# Patient Record
Sex: Female | Born: 1970 | Race: Black or African American | Hispanic: No | Marital: Married | Smoking: Never smoker
Health system: Southern US, Community
[De-identification: ages and names within clinical notes are randomized; demographics above are authoritative.]

## PROBLEM LIST (undated history)

## (undated) DIAGNOSIS — I639 Cerebral infarction, unspecified: Secondary | ICD-10-CM

## (undated) DIAGNOSIS — N289 Disorder of kidney and ureter, unspecified: Secondary | ICD-10-CM

## (undated) DIAGNOSIS — G43909 Migraine, unspecified, not intractable, without status migrainosus: Secondary | ICD-10-CM

## (undated) DIAGNOSIS — I10 Essential (primary) hypertension: Secondary | ICD-10-CM

## (undated) DIAGNOSIS — F419 Anxiety disorder, unspecified: Secondary | ICD-10-CM

## (undated) DIAGNOSIS — E119 Type 2 diabetes mellitus without complications: Secondary | ICD-10-CM

## (undated) HISTORY — PX: ABDOMINAL HYSTERECTOMY: SHX81

## (undated) HISTORY — PX: OTHER SURGICAL HISTORY: SHX169

---

## 2001-03-29 ENCOUNTER — Emergency Department (HOSPITAL_COMMUNITY): Admission: EM | Admit: 2001-03-29 | Discharge: 2001-03-29 | Payer: Self-pay | Admitting: Emergency Medicine

## 2001-12-23 ENCOUNTER — Emergency Department (HOSPITAL_COMMUNITY): Admission: EM | Admit: 2001-12-23 | Discharge: 2001-12-24 | Payer: Self-pay | Admitting: Emergency Medicine

## 2004-09-30 ENCOUNTER — Ambulatory Visit: Payer: Self-pay | Admitting: Internal Medicine

## 2004-10-21 ENCOUNTER — Ambulatory Visit: Payer: Self-pay | Admitting: Internal Medicine

## 2005-03-27 ENCOUNTER — Emergency Department (HOSPITAL_COMMUNITY): Admission: EM | Admit: 2005-03-27 | Discharge: 2005-03-27 | Payer: Self-pay | Admitting: Emergency Medicine

## 2005-05-26 ENCOUNTER — Ambulatory Visit: Payer: Self-pay | Admitting: Internal Medicine

## 2005-06-20 ENCOUNTER — Ambulatory Visit: Payer: Self-pay | Admitting: Internal Medicine

## 2005-07-15 ENCOUNTER — Inpatient Hospital Stay (HOSPITAL_COMMUNITY): Admission: RE | Admit: 2005-07-15 | Discharge: 2005-07-16 | Payer: Self-pay | Admitting: Obstetrics and Gynecology

## 2005-07-21 ENCOUNTER — Inpatient Hospital Stay (HOSPITAL_COMMUNITY): Admission: AD | Admit: 2005-07-21 | Discharge: 2005-07-21 | Payer: Self-pay | Admitting: Obstetrics & Gynecology

## 2007-07-20 ENCOUNTER — Emergency Department (HOSPITAL_COMMUNITY): Admission: EM | Admit: 2007-07-20 | Discharge: 2007-07-20 | Payer: Self-pay | Admitting: Emergency Medicine

## 2008-06-15 ENCOUNTER — Emergency Department (HOSPITAL_COMMUNITY): Admission: EM | Admit: 2008-06-15 | Discharge: 2008-06-15 | Payer: Self-pay | Admitting: Emergency Medicine

## 2008-08-09 ENCOUNTER — Ambulatory Visit (HOSPITAL_BASED_OUTPATIENT_CLINIC_OR_DEPARTMENT_OTHER): Admission: RE | Admit: 2008-08-09 | Discharge: 2008-08-09 | Payer: Self-pay | Admitting: General Surgery

## 2008-08-09 ENCOUNTER — Encounter (HOSPITAL_BASED_OUTPATIENT_CLINIC_OR_DEPARTMENT_OTHER): Payer: Self-pay | Admitting: General Surgery

## 2009-01-04 ENCOUNTER — Encounter: Admission: RE | Admit: 2009-01-04 | Discharge: 2009-01-04 | Payer: Self-pay | Admitting: Emergency Medicine

## 2009-03-15 ENCOUNTER — Encounter: Admission: RE | Admit: 2009-03-15 | Discharge: 2009-03-15 | Payer: Self-pay | Admitting: Emergency Medicine

## 2010-03-25 ENCOUNTER — Encounter: Admission: RE | Admit: 2010-03-25 | Discharge: 2010-03-25 | Payer: Self-pay | Admitting: Emergency Medicine

## 2010-04-11 ENCOUNTER — Ambulatory Visit (HOSPITAL_COMMUNITY): Admission: RE | Admit: 2010-04-11 | Discharge: 2010-04-11 | Payer: Self-pay | Admitting: Emergency Medicine

## 2010-04-21 ENCOUNTER — Encounter: Admission: RE | Admit: 2010-04-21 | Discharge: 2010-04-21 | Payer: Self-pay | Admitting: Emergency Medicine

## 2010-08-03 ENCOUNTER — Emergency Department (HOSPITAL_COMMUNITY): Admission: EM | Admit: 2010-08-03 | Discharge: 2010-08-03 | Payer: Self-pay | Admitting: Emergency Medicine

## 2011-03-06 LAB — DIFFERENTIAL
Basophils Absolute: 0 10*3/uL (ref 0.0–0.1)
Basophils Relative: 0 % (ref 0–1)
Eosinophils Absolute: 0 10*3/uL (ref 0.0–0.7)
Eosinophils Relative: 1 % (ref 0–5)
Lymphocytes Relative: 27 % (ref 12–46)
Lymphs Abs: 1.5 10*3/uL (ref 0.7–4.0)
Monocytes Absolute: 0.6 10*3/uL (ref 0.1–1.0)
Monocytes Relative: 10 % (ref 3–12)

## 2011-03-06 LAB — URINALYSIS, ROUTINE W REFLEX MICROSCOPIC
Bilirubin Urine: NEGATIVE
Hgb urine dipstick: NEGATIVE
Ketones, ur: NEGATIVE mg/dL
Nitrite: NEGATIVE
Specific Gravity, Urine: 1.023 (ref 1.005–1.030)
pH: 7.5 (ref 5.0–8.0)

## 2011-03-06 LAB — CBC
HCT: 39.2 % (ref 36.0–46.0)
Hemoglobin: 13.5 g/dL (ref 12.0–15.0)
MCHC: 34.4 g/dL (ref 30.0–36.0)
MCV: 91.3 fL (ref 78.0–100.0)
Platelets: 291 10*3/uL (ref 150–400)
RDW: 14.6 % (ref 11.5–15.5)

## 2011-03-06 LAB — WET PREP, GENITAL: Clue Cells Wet Prep HPF POC: NONE SEEN

## 2011-03-06 LAB — COMPREHENSIVE METABOLIC PANEL
ALT: 18 U/L (ref 0–35)
Alkaline Phosphatase: 59 U/L (ref 39–117)
GFR calc Af Amer: >60 mL/min (ref >60)
Sodium: 142 mEq/L (ref 135–145)
Total Bilirubin: 0.4 mg/dL (ref 0.3–1.2)

## 2011-03-06 LAB — GC/CHLAMYDIA PROBE AMP, GENITAL
Chlamydia, DNA Probe: NEGATIVE
GC Probe Amp, Genital: NEGATIVE

## 2011-03-06 LAB — URINE CULTURE

## 2011-04-19 ENCOUNTER — Emergency Department (HOSPITAL_COMMUNITY)
Admission: EM | Admit: 2011-04-19 | Discharge: 2011-04-19 | Disposition: A | Discharge status: Home / Self Care | Payer: BC Managed Care – PPO | Attending: Emergency Medicine | Admitting: Emergency Medicine

## 2011-04-19 DIAGNOSIS — Y9229 Other specified public building as the place of occurrence of the external cause: Secondary | ICD-10-CM | POA: Insufficient documentation

## 2011-04-19 DIAGNOSIS — W2203XA Walked into furniture, initial encounter: Secondary | ICD-10-CM | POA: Insufficient documentation

## 2011-04-19 DIAGNOSIS — S81009A Unspecified open wound, unspecified knee, initial encounter: Secondary | ICD-10-CM | POA: Insufficient documentation

## 2011-04-19 DIAGNOSIS — Y9341 Activity, dancing: Secondary | ICD-10-CM | POA: Insufficient documentation

## 2011-04-26 ENCOUNTER — Emergency Department (HOSPITAL_COMMUNITY): Payer: BC Managed Care – PPO

## 2011-04-26 ENCOUNTER — Emergency Department (HOSPITAL_COMMUNITY)
Admission: EM | Admit: 2011-04-26 | Discharge: 2011-04-26 | Disposition: A | Discharge status: Home / Self Care | Payer: BC Managed Care – PPO | Attending: Emergency Medicine | Admitting: Emergency Medicine

## 2011-04-26 DIAGNOSIS — K7689 Other specified diseases of liver: Secondary | ICD-10-CM | POA: Insufficient documentation

## 2011-04-26 DIAGNOSIS — Z9079 Acquired absence of other genital organ(s): Secondary | ICD-10-CM | POA: Insufficient documentation

## 2011-04-26 DIAGNOSIS — R10813 Right lower quadrant abdominal tenderness: Secondary | ICD-10-CM | POA: Insufficient documentation

## 2011-04-26 DIAGNOSIS — Z90710 Acquired absence of both cervix and uterus: Secondary | ICD-10-CM | POA: Insufficient documentation

## 2011-04-26 DIAGNOSIS — R112 Nausea with vomiting, unspecified: Secondary | ICD-10-CM | POA: Insufficient documentation

## 2011-04-26 DIAGNOSIS — R1904 Left lower quadrant abdominal swelling, mass and lump: Secondary | ICD-10-CM | POA: Insufficient documentation

## 2011-04-26 DIAGNOSIS — R109 Unspecified abdominal pain: Secondary | ICD-10-CM | POA: Insufficient documentation

## 2011-04-26 LAB — COMPREHENSIVE METABOLIC PANEL
ALT: 12 U/L (ref 0–35)
AST: 17 U/L (ref 0–37)
Albumin: 3.7 g/dL (ref 3.5–5.2)
GFR calc Af Amer: >60 mL/min (ref >60)
Glucose, Bld: 102 mg/dL — ABNORMAL HIGH (ref 70–99)
Potassium: 3.9 mEq/L (ref 3.5–5.1)

## 2011-04-26 LAB — CBC
HCT: 39.8 % (ref 36.0–46.0)
MCH: 30 pg (ref 26.0–34.0)
Platelets: 291 10*3/uL (ref 150–400)
RDW: 14 % (ref 11.5–15.5)
WBC: 5.6 10*3/uL (ref 4.0–10.5)

## 2011-04-26 LAB — DIFFERENTIAL
Basophils Absolute: 0 10*3/uL (ref 0.0–0.1)
Eosinophils Absolute: 0.1 10*3/uL (ref 0.0–0.7)
Monocytes Absolute: 0.2 10*3/uL (ref 0.1–1.0)
Monocytes Relative: 4 % (ref 3–12)
Neutrophils Relative %: 69 % (ref 43–77)

## 2011-04-26 LAB — URINALYSIS, ROUTINE W REFLEX MICROSCOPIC
Glucose, UA: NEGATIVE mg/dL
Nitrite: NEGATIVE
Protein, ur: NEGATIVE mg/dL

## 2011-04-26 MED ORDER — IOHEXOL 300 MG/ML  SOLN
125.0000 mL | Freq: Once | INTRAMUSCULAR | Status: AC | PRN
  Administered 2011-04-26: 125 mL via INTRAVENOUS

## 2011-05-05 NOTE — Op Note (Signed)
Emily Carrillo, Emily Carrillo                 ACCOUNT NO.:  [id number]   MEDICAL RECORD NO.:  [id number]          PATIENT TYPE:  AMB   LOCATION:  DSC                          FACILITY:  [facility]   PHYSICIAN:  Leonie Man, M.D.   DATE OF BIRTH:  04/23/1971   DATE OF PROCEDURE:  08/09/2008  DATE OF DISCHARGE:                               OPERATIVE REPORT   PREOPERATIVE DIAGNOSIS:  Mass right parietal scalp.   POSTOPERATIVE DIAGNOSIS:  Osteophyte, parietal scalp on the right.   PROCEDURE:  Excision osteophyte of the skull.   SURGEON:  Leonie Man, MD   ASSISTANT:  OR nurse.   ANESTHESIA:  General.   SPECIMENS TO LAB:  Bony osteophyte.   ESTIMATED BLOOD LOSS:  Minimal.   COMPLICATIONS:  None apparent.   DISPOSITION:  The patient returned to the PACU in excellent condition.   The patient is a 40 year old female with pain and mass in the scalp on  the right side in the parietal area.  This has been causing her  increasing discomfort particularly when touched or whenever she combs  her hair.  She comes to the operating room now desiring to have this  removed.   PROCEDURE:  The patient was positioned in the lateral position.  Following the induction of satisfactory general anesthesia, the region  of the parietal scalp was then shaved and then prepped and draped to be  included in a sterile operative field.  This region was infiltrated with  0.5% Marcaine with epinephrine.  A transverse incision was made down  through the scalp, carrying the dissection down through the parietal  muscles of the scalp down to the calvarium where the mass was  encountered.  This was excised by using an osteotome to shave this mass  off.  The edges were then rasped with a small bone rasp, until smooth.  All the area of the dissection was checked for hemostasis and no  additional bleeding points were noted.  Sponge and instruments counts  verified.  The parietal  muscles closed with 2-0 Vicryl sutures.   The skin was closed with  interrupted 4-0 Prolene sutures.  The wound was then reinforced with  Dermabond and a sterile compressive dressing was placed.  The anesthetic  reversed, and the patient removed from the operating room to the  recovery room in stable condition.  She tolerated the procedure well.      Leonie Man, M.D.  Electronically Signed     PB/MEDQ  D:  08/09/2008  T:  08/10/2008  Job:  [id number]   cc:   Dollene Cleveland, M.D.

## 2011-05-08 NOTE — Op Note (Signed)
NAME:  Emily Carrillo, Emily Carrillo                  ACCOUNT NO.:  [id number]   MEDICAL RECORD NO.:  [id number]          PATIENT TYPE:  OBV   LOCATION:  9305                          FACILITY:  [facility]   PHYSICIAN:  Randye Lobo, M.D.   DATE OF BIRTH:  1971-04-20   DATE OF PROCEDURE:  07/14/2005  DATE OF DISCHARGE:                                 OPERATIVE REPORT   PREOPERATIVE DIAGNOSES:  1.  Pelvic pain.  2.  Genuine stress incontinence.  3.  First degree cystocele.  4.  Minimal rectocele.   POSTOPERATIVE DIAGNOSIS:  1.  Pelvic pain.  2.  Genuine stress incontinence.  3.  First degree cystocele.  4.  Minimal rectocele.  5.  Minimal endometriosis.  6.  Possible interstitial cystitis.   PROCEDURES:  1.  Laparoscopic fulguration of endometriosis.  2.  Tension-free vaginal tape sling.  3.  Cystoscopy.  4.  Anterior and posterior colporrhaphy.   SURGEON:  Conley Simmonds, MD.   ASSISTANT:  Carrington Clamp, MD   ANESTHESIA:  General endotracheal.   IV FLUIDS:  1700 mL Ringer's lactate.   ESTIMATED BLOOD LOSS:  125 mL.   URINE OUTPUT:  200 mL.   COMPLICATIONS:  None.   INDICATIONS FOR PROCEDURE:  The patient is a 40 year old gravida 2, para 2,  African-American female, status post laparoscopically-assisted vaginal  hysterectomy with unilateral salpingo-oophorectomy for endometriosis in  1995, who presented with bilateral lower abdominal pain, dyspareunia,  significant urinary urgency and frequency, and urinary incontinence with  coughing and running.  The patient had an ultrasound performed on May 14, 2005 which documented a normal left ovary and an absent uterus and right  ovary.  The patient also had reported significant constipation, which was  evaluated by a colonoscopy in 2004.  The colonoscopy was normal.  The  patient was noted on pelvic examination to have fullness in the right  adnexal region and no evidence of a left adnexal mass.  There was a first  degree cystocele  appreciated and a minimal rectocele appreciated.  The  patient did report that her constipation was significant enough to require  perineal splinting on a consistent basis with bowel movements.  Office  cystometric were performed, which documented the presence of genuine stress  incontinence with 250 mL of maximum bladder capacity.   A plan was made to proceed with a laparoscopy with possible lysis of  adhesions and treatment of endometriosis, possible left salpingo-  oophorectomy, a tension-free vaginal tape procedure with cystoscopy, and an  the anterior and posterior colporrhaphy after risks, benefits, and  alternatives were discussed with the patient.   FINDINGS:  Laparoscopy demonstrated an absent uterus and right fallopian  tube and ovary.  There was a normal-appearing left fallopian tube and ovary.  There was evidence of a staple line across the entire vaginal cuff and into  the parametrium regions bilaterally consistent with the patient's prior  laparoscopically-assisted vaginal hysterectomy.  There were areas of  endometriosis at the top of the vaginal cuff, along the left uterosacral  ligament, and in the left cul-de-sac area.  These were minimal in size.  There was no evidence of any adhesive disease in the abdomen or pelvis.  The  appendix and the liver were unremarkable.   Cystoscopy demonstrated the absence of a foreign body in both the urethra  and the bladder after passing the abdominal needle TVT passers as well as  after placing the TVT sling in its proper location.  The ureters were noted to be patent bilaterally.  The bladder was visualized  throughout 360 degrees including the bladder dome and trigone.  After  filling the bladder to 225 mL, it was possible to view the development of  multiple petechial hemorrhages throughout the entire bladder.  There was no  evidence of any ulcerative formation prior to the bladder filling.  The  bleeding areas were consistent with  petechial hemorrhages.   SPECIMENS:  None.   PROCEDURES:  The patient was reidentified in the preoperative holding area.  She did receive Ancef 1 g intravenously for antibiotic prophylaxis.  The  patient was brought to the operating room, where she received general  endotracheal anesthesia.  She was then placed in the dorsal lithotomy  position and the abdomen and vagina were sterilely prepped and a Foley  catheter was sterilely placed inside the bladder after she was draped.  A  sponge stick was placed inside the vagina.   The laparoscopy began with a 1 cm umbilical incision, which was created  sharply with a scalpel.  The incision was carried down to the fascia with  blunt dissection with the Allis clamp.  A 10 mm trocar was then inserted  directly into the peritoneal cavity and the laparoscope confirmed proper  placement.  A pneumoperitoneum was achieved with CO2 gas and the patient was  placed in the Trendelenburg position.   A 5 mm suprapubic incision was created with a scalpel and a 5 mm trocar was  placed under direct visualization of the laparoscope.  An inspection of the  pelvic and abdominal organs began, and the findings are as noted above.  A  Kleppinger forceps was then used to fulgurate the lesions of endometriosis  over the vaginal cuff using bipolar cautery.  Bipolar cautery was similarly  used to fulgurate lesions of endometriosis on the left uterosacral ligament  and in the left posterior cul-de-sac.  Hemostasis was excellent at this time.  This concluded the laparoscopic  portion of the procedure.  The suprapubic trocar was removed under  visualization of the laparoscope.  The pneumoperitoneum was released and the  10 mm umbilical trocar and laparoscope were removed simultaneously.  The  incisions were closed with subcuticular sutures of 3-0 plain gut suture and  a sterile bandage was placed over the umbilical incision.  The vaginal portion of the procedure was  begun at this time.  Allis clamps  were used to mark the midline of the anterior vaginal wall, which was then  injected with 0.5% lidocaine with 1:200,000 of epinephrine.  The vaginal  mucosa was then incised vertically with a scalpel.  A Metzenbaum scissors  was used to dissect the subvaginal tissue off of the vaginal mucosa  bilaterally.  The dissection was carried back to the level of the pubic rami  bilaterally.  Hemostasis was created with monopolar cautery over the  bladder.   The suprapubic sites for passing the TVT needles were then selected.  These  were created 2 cm to the right and left of the midline suprapubically.  The  TVT was then performed  in a top-down fashion.  The TVT needle was placed  first through the right suprapubic incision and out through the vagina on  the ipsilateral side, staying lateral to the urethra along the midurethra.  The same procedure that was performed on the right-hand side was then  repeated on the left-hand side with the other TVT abdominal needle passer.  The Foley catheter was removed at this time and cystoscopy was performed,  and the findings are as noted above.  The bladder was then drained of all  cystoscopy fluid and the TVT needles with the sling attached were then drawn  up through the suprapubic incisions bilaterally.  Final cystoscopy was  performed and the findings again demonstrated no evidence of a foreign body  in the bladder or the urethra.  Final tension on the TVT sling was adjusted  after the plastic sheaths had been removed.  There was good mobility noted  underneath the TVT sling.  Excess sling material was then trimmed  suprapubically.  The anterior colporrhaphy was performed next with a series  of vertical mattress sutures of both 2-0 and 0 Vicryl.  There was some  bleeding noted in a venous plexus overlying the bladder and the patient's  left-hand side.  This responded to figure-of-eight sutures of 2-0 Vicryl.  Excess  vaginal mucosa was then trimmed and the anterior vaginal wall was  closed with interrupted sutures of 2-0 Vicryl.   The Foley catheter was left to gravity drainage at this time.   Posterior colporrhaphy was performed last.  Allises were used to mark the  midline of the posterior vaginal wall.  The posterior vaginal mucosa was  injected with 0.5% lidocaine with 1:200,000 of epinephrine.  A triangular  wedge of epithelium was excised from the perineal body, and the vaginal  mucosa was then incised vertically with a Metzenbaum scissors.  The  perirectal fascia was dissected off the overlying vaginal mucosa  bilaterally.  A series of interrupted 2-0 Vicryl sutures were used to  perform a site-specific repair of fascial defects.  This was reinforced by  placing vertical mattress sutures of 0 Vicryl for reduction of the  rectocele.  Excess vaginal mucosa was then trimmed away and the posterior vaginal wall was closed with a running locked suture of 2-0 Vicryl, which  was continued along the perineum as a subcuticular suture.  The knot was  tied inside the hymen.  There was some bleeding noted along the vaginal  mucosa at the apex of the posterior vaginal wall closure, and this responded  well to a figure-of-eight suture of 2-0 Vicryl.   The suprapubic incisions were then closed with Dermabond, including the  laparoscopic suprapubic incision.  A plain gauze packing with Premarin cream  was then placed inside the vagina.   This concluded the patient's procedure.  She was cleansed of any remaining  Betadine.  There were no complications to the procedure.  All needle,  instrument, sponge counts were correct.  The patient is escorted to the  recovery room in stable and awake condition.      Randye Lobo, M.D.  Electronically Signed     BES/MEDQ  D:  07/14/2005  T:  07/14/2005  Job:  [id number]

## 2011-07-21 ENCOUNTER — Emergency Department (HOSPITAL_COMMUNITY): Payer: BC Managed Care – PPO

## 2011-07-21 ENCOUNTER — Emergency Department (HOSPITAL_COMMUNITY)
Admission: EM | Admit: 2011-07-21 | Discharge: 2011-07-21 | Disposition: A | Discharge status: Home / Self Care | Payer: BC Managed Care – PPO | Attending: Emergency Medicine | Admitting: Emergency Medicine

## 2011-07-21 DIAGNOSIS — R42 Dizziness and giddiness: Secondary | ICD-10-CM | POA: Insufficient documentation

## 2011-07-21 DIAGNOSIS — R51 Headache: Secondary | ICD-10-CM | POA: Insufficient documentation

## 2011-07-21 DIAGNOSIS — H539 Unspecified visual disturbance: Secondary | ICD-10-CM | POA: Insufficient documentation

## 2011-07-21 DIAGNOSIS — H5500 Unspecified nystagmus: Secondary | ICD-10-CM | POA: Insufficient documentation

## 2011-07-21 LAB — RAPID URINE DRUG SCREEN, HOSP PERFORMED
Amphetamines: NOT DETECTED
Barbiturates: NOT DETECTED
Benzodiazepines: NOT DETECTED

## 2011-07-21 LAB — POCT I-STAT, CHEM 8
Creatinine, Ser: 0.6 mg/dL (ref 0.50–1.10)
Hemoglobin: 15.6 g/dL — ABNORMAL HIGH (ref 12.0–15.0)
Sodium: 138 mEq/L (ref 135–145)
TCO2: 23 mmol/L (ref 0–100)

## 2011-07-21 LAB — URINALYSIS, ROUTINE W REFLEX MICROSCOPIC
Bilirubin Urine: NEGATIVE
Glucose, UA: NEGATIVE mg/dL
Ketones, ur: NEGATIVE mg/dL
Nitrite: NEGATIVE
pH: 7.5 (ref 5.0–8.0)

## 2011-07-21 LAB — POCT PREGNANCY, URINE: Preg Test, Ur: NEGATIVE

## 2011-07-21 MED ORDER — GADOBENATE DIMEGLUMINE 529 MG/ML IV SOLN
20.0000 mL | Freq: Once | INTRAVENOUS | Status: AC | PRN
  Administered 2011-07-21: 20 mL via INTRAVENOUS

## 2011-09-17 LAB — POCT I-STAT, CHEM 8
Hemoglobin: 13.9
Sodium: 139
TCO2: 27

## 2011-10-05 LAB — CBC
HCT: 39.7
MCHC: 32.8
Platelets: 323
RDW: 13.5

## 2011-10-05 LAB — BASIC METABOLIC PANEL
BUN: 13
Calcium: 9.1
GFR calc non Af Amer: >60
Glucose, Bld: 109 — ABNORMAL HIGH
Potassium: 3.4 — ABNORMAL LOW

## 2011-10-05 LAB — URINALYSIS, ROUTINE W REFLEX MICROSCOPIC
Leukocytes, UA: NEGATIVE
Protein, ur: NEGATIVE
Urobilinogen, UA: 0.2

## 2011-10-05 LAB — DIFFERENTIAL
Basophils Absolute: 0
Basophils Relative: 0
Eosinophils Relative: 0
Lymphocytes Relative: 24

## 2011-10-05 LAB — URINE MICROSCOPIC-ADD ON

## 2012-01-08 ENCOUNTER — Other Ambulatory Visit: Payer: Self-pay | Admitting: Family Medicine

## 2012-01-08 DIAGNOSIS — Z1231 Encounter for screening mammogram for malignant neoplasm of breast: Secondary | ICD-10-CM

## 2012-01-14 ENCOUNTER — Other Ambulatory Visit: Payer: Self-pay | Admitting: Family Medicine

## 2012-01-14 ENCOUNTER — Other Ambulatory Visit: Payer: Self-pay

## 2012-01-14 ENCOUNTER — Other Ambulatory Visit (HOSPITAL_COMMUNITY)
Admission: RE | Admit: 2012-01-14 | Discharge: 2012-01-14 | Disposition: A | Discharge status: Home / Self Care | Payer: BC Managed Care – PPO | Source: Ambulatory Visit | Attending: Family Medicine | Admitting: Family Medicine

## 2012-01-14 ENCOUNTER — Ambulatory Visit
Admission: RE | Admit: 2012-01-14 | Discharge: 2012-01-14 | Disposition: A | Discharge status: Home / Self Care | Payer: BC Managed Care – PPO | Source: Ambulatory Visit | Attending: Family Medicine | Admitting: Family Medicine

## 2012-01-14 DIAGNOSIS — Z01419 Encounter for gynecological examination (general) (routine) without abnormal findings: Secondary | ICD-10-CM | POA: Insufficient documentation

## 2012-01-14 DIAGNOSIS — Z1231 Encounter for screening mammogram for malignant neoplasm of breast: Secondary | ICD-10-CM

## 2012-01-14 DIAGNOSIS — N644 Mastodynia: Secondary | ICD-10-CM

## 2012-01-22 ENCOUNTER — Ambulatory Visit
Admission: RE | Admit: 2012-01-22 | Discharge: 2012-01-22 | Disposition: A | Discharge status: Home / Self Care | Payer: BC Managed Care – PPO | Source: Ambulatory Visit | Attending: Family Medicine | Admitting: Family Medicine

## 2012-01-22 DIAGNOSIS — N644 Mastodynia: Secondary | ICD-10-CM

## 2012-10-09 ENCOUNTER — Emergency Department (HOSPITAL_COMMUNITY)
Admission: EM | Admit: 2012-10-09 | Discharge: 2012-10-10 | Disposition: A | Discharge status: Other Acute Inpatient Hospital | Payer: BC Managed Care – PPO | Attending: Emergency Medicine | Admitting: Emergency Medicine

## 2012-10-09 ENCOUNTER — Encounter (HOSPITAL_COMMUNITY): Payer: Self-pay | Admitting: *Deleted

## 2012-10-09 ENCOUNTER — Other Ambulatory Visit: Payer: Self-pay

## 2012-10-09 DIAGNOSIS — F411 Generalized anxiety disorder: Secondary | ICD-10-CM | POA: Insufficient documentation

## 2012-10-09 DIAGNOSIS — T50901A Poisoning by unspecified drugs, medicaments and biological substances, accidental (unintentional), initial encounter: Secondary | ICD-10-CM

## 2012-10-09 DIAGNOSIS — F329 Major depressive disorder, single episode, unspecified: Secondary | ICD-10-CM

## 2012-10-09 DIAGNOSIS — T400X1A Poisoning by opium, accidental (unintentional), initial encounter: Secondary | ICD-10-CM | POA: Insufficient documentation

## 2012-10-09 HISTORY — DX: Anxiety disorder, unspecified: F41.9

## 2012-10-09 LAB — URINALYSIS, ROUTINE W REFLEX MICROSCOPIC
Bilirubin Urine: NEGATIVE
Hgb urine dipstick: NEGATIVE
Protein, ur: NEGATIVE mg/dL
Urobilinogen, UA: 1 mg/dL (ref 0.0–1.0)

## 2012-10-09 LAB — CBC
Hemoglobin: 14.6 g/dL (ref 12.0–15.0)
MCHC: 34.1 g/dL (ref 30.0–36.0)
WBC: 4.7 10*3/uL (ref 4.0–10.5)

## 2012-10-09 LAB — RAPID URINE DRUG SCREEN, HOSP PERFORMED
Barbiturates: NOT DETECTED
Benzodiazepines: NOT DETECTED
Cocaine: NOT DETECTED
Tetrahydrocannabinol: NOT DETECTED

## 2012-10-09 LAB — COMPREHENSIVE METABOLIC PANEL
ALT: 14 U/L (ref 0–35)
Albumin: 4.4 g/dL (ref 3.5–5.2)
Alkaline Phosphatase: 80 U/L (ref 39–117)
Chloride: 100 mEq/L (ref 96–112)
GFR calc Af Amer: >90 mL/min (ref >90)
Glucose, Bld: 78 mg/dL (ref 70–99)
Potassium: 4.1 mEq/L (ref 3.5–5.1)
Sodium: 136 mEq/L (ref 135–145)
Total Protein: 8.4 g/dL — ABNORMAL HIGH (ref 6.0–8.3)

## 2012-10-09 LAB — ACETAMINOPHEN LEVEL: Acetaminophen (Tylenol), Serum: <15 ug/mL (ref 10–30)

## 2012-10-09 MED ORDER — ACETAMINOPHEN 325 MG PO TABS: 650.0000 mg | ORAL_TABLET | ORAL | Status: DC | PRN

## 2012-10-09 MED ORDER — ONDANSETRON HCL 4 MG PO TABS: 4.0000 mg | ORAL_TABLET | Freq: Three times a day (TID) | ORAL | Status: DC | PRN

## 2012-10-09 MED ORDER — IBUPROFEN 600 MG PO TABS: 600.0000 mg | ORAL_TABLET | Freq: Three times a day (TID) | ORAL | Status: DC | PRN

## 2012-10-09 MED ORDER — ALUM & MAG HYDROXIDE-SIMETH 200-200-20 MG/5ML PO SUSP: 30.0000 mL | ORAL | Status: DC | PRN

## 2012-10-09 MED ORDER — ZOLPIDEM TARTRATE 5 MG PO TABS: 5.0000 mg | ORAL_TABLET | Freq: Every evening | ORAL | Status: DC | PRN

## 2012-10-09 MED ORDER — LORAZEPAM 1 MG PO TABS: 1.0000 mg | ORAL_TABLET | Freq: Three times a day (TID) | ORAL | Status: DC | PRN

## 2012-10-09 NOTE — ED Notes (Signed)
Pt wanded by security; clothing removed and placed in blue scrubs; family at bedside

## 2012-10-09 NOTE — ED Notes (Addendum)
Pt reports that she has been hearing voices for a few months, pt tearful. Pt reports she took several tramadol. Pt reports she "is just tired and fatigued" and wants to get some sleep. Pt denies SI/ HI. Denies visual hallucinations.   Upon arrival pt uncommunicative and uncooperative. Now pt is more complaint and working with staff.   Pt reports she does not want her husband Jerri Glauser to let allowed to see her in the hospital or be allowed to visit.

## 2012-10-09 NOTE — ED Provider Notes (Signed)
History     CSN: [id number]  Arrival date & time 10/09/12  1827   First MD Initiated Contact with Patient 10/09/12 1920      Chief Complaint  Patient presents with  . Medical Clearance    (Consider location/radiation/quality/duration/timing/severity/associated sxs/prior treatment) The history is provided by the patient.   patient presents with depression and hallucinations. She states that feeling bad for a while. He states she is becoming more and more depressed. She started to have hallucinations telling her that she is worthless. She states that she doesn't have clear depression history but is been previously depressed. She has had an MRI rather recently. She denies substance abuse. She states that she took several tramadol because she wanted to make it all stopped. She denies suicidal attempt. No nausea vomiting diarrhea. No chest pain. No confusion. Past Medical History  Diagnosis Date  . Anxiety     No past surgical history on file.  No family history on file.  History  Substance Use Topics  . Smoking status: Not on file  . Smokeless tobacco: Not on file  . Alcohol Use:     OB History    Grav Para Term Preterm Abortions TAB SAB Ect Mult Living                  Review of Systems  Constitutional: Positive for unexpected weight change. Negative for activity change and appetite change.  HENT: Negative for neck stiffness.   Eyes: Negative for pain.  Respiratory: Negative for chest tightness and shortness of breath.   Cardiovascular: Negative for chest pain and leg swelling.  Gastrointestinal: Negative for nausea, vomiting, abdominal pain and diarrhea.  Genitourinary: Negative for flank pain.  Musculoskeletal: Negative for back pain.  Skin: Negative for rash.  Neurological: Negative for weakness, numbness and headaches.  Psychiatric/Behavioral: Positive for dysphoric mood. Negative for behavioral problems.    Allergies  Penicillins  Home Medications  No  current outpatient prescriptions on file.  BP 107/62  Pulse 88  Resp 13  SpO2 99%  Physical Exam  Nursing note and vitals reviewed. Constitutional: She is oriented to person, place, and time. She appears well-developed and well-nourished.  HENT:  Head: Normocephalic.  Eyes: EOM are normal. Pupils are equal, round, and reactive to light.  Neck: Normal range of motion. Neck supple.  Cardiovascular: Normal rate, regular rhythm and normal heart sounds.   No murmur heard. Pulmonary/Chest: Effort normal and breath sounds normal. No respiratory distress. She has no wheezes. She has no rales.  Abdominal: Soft. Bowel sounds are normal. She exhibits no distension. There is no tenderness. There is no rebound and no guarding.  Musculoskeletal: Normal range of motion.  Neurological: She is alert and oriented to person, place, and time. No cranial nerve deficit.  Skin: Skin is warm and dry.  Psychiatric: Her speech is normal.       Patient appears depressed. She has a rather flat affect.    ED Course  Procedures (including critical care time)  Labs Reviewed  COMPREHENSIVE METABOLIC PANEL - Abnormal; Notable for the following:    Total Protein 8.4 (*)     All other components within normal limits  SALICYLATE LEVEL - Abnormal; Notable for the following:    Salicylate Lvl <2.0 (*)     All other components within normal limits  ACETAMINOPHEN LEVEL  CBC  ETHANOL  URINE RAPID DRUG SCREEN (HOSP PERFORMED)  POCT PREGNANCY, URINE  URINALYSIS, ROUTINE W REFLEX MICROSCOPIC   No results  found.   1. Depression   2. Overdose       MDM   Date: 10/09/2012  Rate: 88  Rhythm: normal sinus rhythm  QRS Axis: normal  Intervals: normal  ST/T Wave abnormalities: normal  Conduction Disutrbances:none  Narrative Interpretation:   Old EKG Reviewed: unchanged        Patient with depression and overdose on Ultram. She denies suicidal attempt appears very depressed. She's also had new  hallucinations telling her she is worthless. She has a rather recent MRI with no mass. She's not appear to need another CAT scan at this time. She appears to medically clear to be seen by the [facility] team.  Juliet Rude. Rubin Payor, MD 10/09/12 1610
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
## 2012-10-09 NOTE — ED Notes (Signed)
PC called for additional information. No new recommendations

## 2012-10-09 NOTE — ED Notes (Signed)
ACT at bedside 

## 2012-10-10 ENCOUNTER — Encounter (HOSPITAL_COMMUNITY): Payer: Self-pay | Admitting: Emergency Medicine

## 2012-10-10 NOTE — ED Notes (Signed)
Pt ambulated to bathroom 

## 2012-10-10 NOTE — BHH Counselor (Signed)
Patient has been declined at [facility] by Dr. Elsie Saas due to no criteria. Attention seeking behavior, no intent.

## 2012-10-10 NOTE — ED Notes (Signed)
Pt ambulate to bathroom 

## 2012-10-10 NOTE — ED Notes (Signed)
Pt belongings sent with Star Day, daughter, who stated she was going to wash her clothes overnight and willing be bringing it back. Pt consented to this.

## 2012-10-10 NOTE — ED Provider Notes (Addendum)
0720.  Pt resting in bed.  Easily aroused, NAD.  Note stable vital signs.  No significant overnight events reported.  She has been recommended for placement by the ACT Team.  Referrals are pending.  Tobin Chad, MD 10/10/12 [phone]  Pt has been accepted by Dr. Hardie Pulley at [facility]  She will be transported there for further treatment.  Tobin Chad, MD 10/10/12 [phone]

## 2012-10-10 NOTE — BH Assessment (Signed)
B[facility] Assessment Progress Note      Faxed referral information to [facility] and [place].  Per Lloyd Huger at [facility], they usually have several discharges on Monday.  He will review after a meeting this morning.

## 2013-03-16 ENCOUNTER — Emergency Department (HOSPITAL_COMMUNITY)
Admission: EM | Admit: 2013-03-16 | Discharge: 2013-03-16 | Disposition: A | Discharge status: Home / Self Care | Payer: BC Managed Care – PPO | Attending: Emergency Medicine | Admitting: Emergency Medicine

## 2013-03-16 ENCOUNTER — Encounter (HOSPITAL_COMMUNITY): Payer: Self-pay | Admitting: *Deleted

## 2013-03-16 ENCOUNTER — Emergency Department (HOSPITAL_COMMUNITY): Payer: BC Managed Care – PPO

## 2013-03-16 DIAGNOSIS — Z79899 Other long term (current) drug therapy: Secondary | ICD-10-CM | POA: Insufficient documentation

## 2013-03-16 DIAGNOSIS — R110 Nausea: Secondary | ICD-10-CM | POA: Insufficient documentation

## 2013-03-16 DIAGNOSIS — S39012A Strain of muscle, fascia and tendon of lower back, initial encounter: Secondary | ICD-10-CM

## 2013-03-16 DIAGNOSIS — R61 Generalized hyperhidrosis: Secondary | ICD-10-CM | POA: Insufficient documentation

## 2013-03-16 DIAGNOSIS — Z8673 Personal history of transient ischemic attack (TIA), and cerebral infarction without residual deficits: Secondary | ICD-10-CM | POA: Insufficient documentation

## 2013-03-16 DIAGNOSIS — M79609 Pain in unspecified limb: Secondary | ICD-10-CM | POA: Insufficient documentation

## 2013-03-16 DIAGNOSIS — S335XXA Sprain of ligaments of lumbar spine, initial encounter: Secondary | ICD-10-CM | POA: Insufficient documentation

## 2013-03-16 DIAGNOSIS — F411 Generalized anxiety disorder: Secondary | ICD-10-CM | POA: Insufficient documentation

## 2013-03-16 DIAGNOSIS — Y939 Activity, unspecified: Secondary | ICD-10-CM | POA: Insufficient documentation

## 2013-03-16 DIAGNOSIS — N289 Disorder of kidney and ureter, unspecified: Secondary | ICD-10-CM | POA: Insufficient documentation

## 2013-03-16 DIAGNOSIS — Y929 Unspecified place or not applicable: Secondary | ICD-10-CM | POA: Insufficient documentation

## 2013-03-16 DIAGNOSIS — X58XXXA Exposure to other specified factors, initial encounter: Secondary | ICD-10-CM | POA: Insufficient documentation

## 2013-03-16 HISTORY — DX: Disorder of kidney and ureter, unspecified: N28.9

## 2013-03-16 HISTORY — DX: Cerebral infarction, unspecified: I63.9

## 2013-03-16 LAB — CBC WITH DIFFERENTIAL/PLATELET
Basophils Absolute: 0 10*3/uL (ref 0.0–0.1)
Basophils Relative: 0 % (ref 0–1)
HCT: 37.6 % (ref 36.0–46.0)
Lymphocytes Relative: 42 % (ref 12–46)
MCHC: 32.7 g/dL (ref 30.0–36.0)
Monocytes Absolute: 0.3 10*3/uL (ref 0.1–1.0)
Neutro Abs: 2.6 10*3/uL (ref 1.7–7.7)
Neutrophils Relative %: 50 % (ref 43–77)
Platelets: 303 10*3/uL (ref 150–400)
RDW: 14.5 % (ref 11.5–15.5)
WBC: 5.3 10*3/uL (ref 4.0–10.5)

## 2013-03-16 LAB — URINALYSIS, ROUTINE W REFLEX MICROSCOPIC
Bilirubin Urine: NEGATIVE
Glucose, UA: NEGATIVE mg/dL
Hgb urine dipstick: NEGATIVE
Protein, ur: NEGATIVE mg/dL
Urobilinogen, UA: 0.2 mg/dL (ref 0.0–1.0)

## 2013-03-16 LAB — COMPREHENSIVE METABOLIC PANEL
ALT: 18 U/L (ref 0–35)
AST: 27 U/L (ref 0–37)
Albumin: 4 g/dL (ref 3.5–5.2)
Alkaline Phosphatase: 86 U/L (ref 39–117)
CO2: 26 mEq/L (ref 19–32)
Chloride: 100 mEq/L (ref 96–112)
GFR calc non Af Amer: 80 mL/min — ABNORMAL LOW (ref >90)
Potassium: 4.2 mEq/L (ref 3.5–5.1)
Sodium: 136 mEq/L (ref 135–145)
Total Bilirubin: 0.2 mg/dL — ABNORMAL LOW (ref 0.3–1.2)

## 2013-03-16 MED ORDER — MORPHINE SULFATE 4 MG/ML IJ SOLN
4.0000 mg | Freq: Once | INTRAMUSCULAR | Status: AC
  Administered 2013-03-16: 4 mg via INTRAVENOUS
  Filled 2013-03-16: qty 1

## 2013-03-16 MED ORDER — NAPROXEN 500 MG PO TABS: 500.0000 mg | ORAL_TABLET | Freq: Two times a day (BID) | ORAL | Status: DC

## 2013-03-16 MED ORDER — METHOCARBAMOL 500 MG PO TABS: 500.0000 mg | ORAL_TABLET | Freq: Two times a day (BID) | ORAL | Status: DC

## 2013-03-16 MED ORDER — SODIUM CHLORIDE 0.9 % IV BOLUS (SEPSIS)
1000.0000 mL | Freq: Once | INTRAVENOUS | Status: AC
  Administered 2013-03-16: 1000 mL via INTRAVENOUS

## 2013-03-16 MED ORDER — ONDANSETRON HCL 4 MG/2ML IJ SOLN
4.0000 mg | Freq: Once | INTRAMUSCULAR | Status: AC
  Administered 2013-03-16: 4 mg via INTRAVENOUS
  Filled 2013-03-16: qty 2

## 2013-03-16 NOTE — ED Notes (Signed)
Pt from home with reports of right flank pain with radiation to RLQ and down right leg. Pt also reports nausea. Pt reports pain began yesterday evening.

## 2013-03-16 NOTE — ED Provider Notes (Signed)
History     CSN: [id number]  Arrival date & time 03/16/13  1811   First MD Initiated Contact with Patient 03/16/13 2011      Chief Complaint  Patient presents with  . Flank Pain    right  . Abdominal Pain    RLQ  . Leg Pain    right  . Nausea   HPI  History provided by the patient and daughter. Patient is a 42 year old female with past history of anxiety and unspecified renal disorder who presents with complaints of right hip and side pain. Symptoms began yesterday evening. Pain is worse with any movements and some positions. It is described as a sharp pain. Pain feels different from history of previous kidney stones in that is slightly lower in the hip and sharper in nature. Patient did not use any medications to help with symptoms. No other treatment with heat or ice. Symptoms have been worsening throughout the day. Denies any other aggravating or alleviating factors. No other associated symptoms. No fever, chills, dysuria, hematuria or urinary frequency. No vomiting, diarrhea or constipation.    Past Medical History  Diagnosis Date  . Anxiety   . Renal disorder   . Stroke     Past Surgical History  Procedure Laterality Date  . Bladder tacking    . Abdominal hysterectomy      History reviewed. No pertinent family history.  History  Substance Use Topics  . Smoking status: Never Smoker   . Smokeless tobacco: Never Used  . Alcohol Use: No    OB History   Grav Para Term Preterm Abortions TAB SAB Ect Mult Living                  Review of Systems  Constitutional: Positive for diaphoresis. Negative for fever and chills.  Respiratory: Negative for shortness of breath.   Cardiovascular: Negative for chest pain.  Gastrointestinal: Positive for nausea. Negative for vomiting, abdominal pain, diarrhea and constipation.  Genitourinary: Negative for dysuria, frequency, hematuria and flank pain.  Musculoskeletal: Positive for back pain.  All other systems reviewed and  are negative.    Allergies  Amoxicillin and Penicillins  Home Medications   Current Outpatient Rx  Name  Route  Sig  Dispense  Refill  . lamoTRIgine (LAMICTAL) 25 MG tablet   Oral   Take 25 mg by mouth 2 (two) times daily.         Marland Kitchen perphenazine (TRILAFON) 4 MG tablet   Oral   Take 4 mg by mouth at bedtime.         . sertraline (ZOLOFT) 100 MG tablet   Oral   Take 100 mg by mouth daily.         . traZODone (DESYREL) 100 MG tablet   Oral   Take 150 mg by mouth daily.           BP 133/77  Pulse 94  Temp(Src) 99.2 F (37.3 C) (Oral)  Resp 19  Wt 200 lb (90.719 kg)  SpO2 99%  Physical Exam  Nursing note and vitals reviewed. Constitutional: She is oriented to person, place, and time. She appears well-developed and well-nourished. No distress.  HENT:  Head: Normocephalic.  Cardiovascular: Normal rate and regular rhythm.   Pulmonary/Chest: Effort normal and breath sounds normal. No respiratory distress.  Abdominal: Soft. There is tenderness in the right lower quadrant. There is no rebound, no guarding, no tenderness at McBurney's point and negative Murphy's sign.  Musculoskeletal: Normal range of  motion.       Lumbar back: She exhibits tenderness. She exhibits no bony tenderness and no swelling.       Back:  Neurological: She is alert and oriented to person, place, and time.  Skin: Skin is warm and dry. No rash noted.  Psychiatric: She has a normal mood and affect. Her behavior is normal.    ED Course  Procedures   Results for orders placed during the hospital encounter of 03/16/13  CBC WITH DIFFERENTIAL      Result Value Range   WBC 5.3  4.0 - 10.5 K/uL   RBC 4.20  3.87 - 5.11 MIL/uL   Hemoglobin 12.3  12.0 - 15.0 g/dL   HCT 16.1  09.6 - 04.5 %   MCV 89.5  78.0 - 100.0 fL   MCH 29.3  26.0 - 34.0 pg   MCHC 32.7  30.0 - 36.0 g/dL   RDW 40.9  81.1 - 91.4 %   Platelets 303  150 - 400 K/uL   Neutrophils Relative 50  43 - 77 %   Neutro Abs 2.6  1.7 -  7.7 K/uL   Lymphocytes Relative 42  12 - 46 %   Lymphs Abs 2.2  0.7 - 4.0 K/uL   Monocytes Relative 6  3 - 12 %   Monocytes Absolute 0.3  0.1 - 1.0 K/uL   Eosinophils Relative 1  0 - 5 %   Eosinophils Absolute 0.1  0.0 - 0.7 K/uL   Basophils Relative 0  0 - 1 %   Basophils Absolute 0.0  0.0 - 0.1 K/uL  COMPREHENSIVE METABOLIC PANEL      Result Value Range   Sodium 136  135 - 145 mEq/L   Potassium 4.2  3.5 - 5.1 mEq/L   Chloride 100  96 - 112 mEq/L   CO2 26  19 - 32 mEq/L   Glucose, Bld 86  70 - 99 mg/dL   BUN 20  6 - 23 mg/dL   Creatinine, Ser 7.82  0.50 - 1.10 mg/dL   Calcium 9.4  8.4 - 95.6 mg/dL   Total Protein 7.9  6.0 - 8.3 g/dL   Albumin 4.0  3.5 - 5.2 g/dL   AST 27  0 - 37 U/L   ALT 18  0 - 35 U/L   Alkaline Phosphatase 86  39 - 117 U/L   Total Bilirubin 0.2 (*) 0.3 - 1.2 mg/dL   GFR calc non Af Amer 80 (*) >90 mL/min   GFR calc Af Amer >90  >90 mL/min  URINALYSIS, ROUTINE W REFLEX MICROSCOPIC      Result Value Range   Color, Urine YELLOW  YELLOW   APPearance CLEAR  CLEAR   Specific Gravity, Urine 1.023  1.005 - 1.030   pH 7.0  5.0 - 8.0   Glucose, UA NEGATIVE  NEGATIVE mg/dL   Hgb urine dipstick NEGATIVE  NEGATIVE   Bilirubin Urine NEGATIVE  NEGATIVE   Ketones, ur NEGATIVE  NEGATIVE mg/dL   Protein, ur NEGATIVE  NEGATIVE mg/dL   Urobilinogen, UA 0.2  0.0 - 1.0 mg/dL   Nitrite NEGATIVE  NEGATIVE   Leukocytes, UA NEGATIVE  NEGATIVE       Ct Abdomen Pelvis Wo Contrast  03/16/2013  *RADIOLOGY REPORT*  Clinical Data: Right flank pain.  CT ABDOMEN AND PELVIS WITHOUT CONTRAST  Technique:  Multidetector CT imaging of the abdomen and pelvis was performed following the standard protocol without intravenous contrast.  Comparison: 04/26/2011.  Findings:  The lung bases are clear.  The liver demonstrates a stable lesion at the dome.  This had the appearance of a hemangioma on the contrast and study and it is unchanged.  No biliary dilatation.  The gallbladder is mildly  contracted.  No common bile duct dilatation.  Stable small calcification near the pancreatic head.  The pancreas is otherwise normal.  The spleen is normal.  Both kidneys are normal.  No renal or obstructing ureteral calculi.  No bladder calculi.  There are numerous phlebolithic calcifications in the pelvis.  The stomach, duodenum, small bowel and colon are grossly normal without oral contrast.  The appendix is normal.  No mesenteric or retroperitoneal mass or adenopathy.  The aorta is normal in caliber.  No atherosclerotic calcifications.  The uterus is surgically absent.  No pelvic mass, adenopathy or free pelvic fluid collections.  No inguinal mass or hernia.  The bony structures are intact.  IMPRESSION: Unremarkable noncontrast CT examination of the abdomen/pelvis.   Original Report Authenticated By: Rudie Meyer, M.D.      1. Low back strain, initial encounter       MDM  8:50 PM patient seen and evaluated. Patient resting comfortably appears in mild-to-moderate discomfort but no acute distress.  Patient much better after fluids and medications. Lab tests and CT have not shown any signs or concerning cause for her symptoms today. This time patient is stable for discharge home.        Angus Seller, PA-C 03/17/13 2039

## 2013-03-18 NOTE — ED Provider Notes (Signed)
Medical screening examination/treatment/procedure(s) were performed by non-physician practitioner and as supervising physician I was immediately available for consultation/collaboration.  Taiwo Fish R. Jesiah Yerby, MD 03/18/13 1439 

## 2013-07-20 ENCOUNTER — Emergency Department (HOSPITAL_COMMUNITY)
Admission: EM | Admit: 2013-07-20 | Discharge: 2013-07-20 | Disposition: A | Discharge status: Home / Self Care | Payer: BC Managed Care – PPO | Attending: Emergency Medicine | Admitting: Emergency Medicine

## 2013-07-20 ENCOUNTER — Encounter (HOSPITAL_COMMUNITY): Payer: Self-pay | Admitting: Emergency Medicine

## 2013-07-20 DIAGNOSIS — A499 Bacterial infection, unspecified: Secondary | ICD-10-CM | POA: Insufficient documentation

## 2013-07-20 DIAGNOSIS — N760 Acute vaginitis: Secondary | ICD-10-CM | POA: Insufficient documentation

## 2013-07-20 DIAGNOSIS — Z87448 Personal history of other diseases of urinary system: Secondary | ICD-10-CM | POA: Insufficient documentation

## 2013-07-20 DIAGNOSIS — Z880 Allergy status to penicillin: Secondary | ICD-10-CM | POA: Insufficient documentation

## 2013-07-20 DIAGNOSIS — Z8673 Personal history of transient ischemic attack (TIA), and cerebral infarction without residual deficits: Secondary | ICD-10-CM | POA: Insufficient documentation

## 2013-07-20 DIAGNOSIS — R110 Nausea: Secondary | ICD-10-CM | POA: Insufficient documentation

## 2013-07-20 DIAGNOSIS — Z79899 Other long term (current) drug therapy: Secondary | ICD-10-CM | POA: Insufficient documentation

## 2013-07-20 DIAGNOSIS — N898 Other specified noninflammatory disorders of vagina: Secondary | ICD-10-CM | POA: Insufficient documentation

## 2013-07-20 DIAGNOSIS — N949 Unspecified condition associated with female genital organs and menstrual cycle: Secondary | ICD-10-CM | POA: Insufficient documentation

## 2013-07-20 DIAGNOSIS — B9689 Other specified bacterial agents as the cause of diseases classified elsewhere: Secondary | ICD-10-CM | POA: Insufficient documentation

## 2013-07-20 DIAGNOSIS — F411 Generalized anxiety disorder: Secondary | ICD-10-CM | POA: Insufficient documentation

## 2013-07-20 LAB — BASIC METABOLIC PANEL
GFR calc Af Amer: 89 mL/min — ABNORMAL LOW (ref >90)
GFR calc non Af Amer: 77 mL/min — ABNORMAL LOW (ref >90)
Potassium: 3.6 mEq/L (ref 3.5–5.1)
Sodium: 136 mEq/L (ref 135–145)

## 2013-07-20 LAB — CBC WITH DIFFERENTIAL/PLATELET
Basophils Absolute: 0 10*3/uL (ref 0.0–0.1)
Basophils Relative: 1 % (ref 0–1)
Eosinophils Absolute: 0 10*3/uL (ref 0.0–0.7)
Hemoglobin: 13.7 g/dL (ref 12.0–15.0)
Lymphs Abs: 1.9 10*3/uL (ref 0.7–4.0)
MCH: 29.4 pg (ref 26.0–34.0)
MCHC: 33.5 g/dL (ref 30.0–36.0)
Neutrophils Relative %: 45 % (ref 43–77)
Platelets: 232 10*3/uL (ref 150–400)
RDW: 14 % (ref 11.5–15.5)
WBC: 4.1 10*3/uL (ref 4.0–10.5)

## 2013-07-20 LAB — URINALYSIS, ROUTINE W REFLEX MICROSCOPIC
Glucose, UA: NEGATIVE mg/dL
Hgb urine dipstick: NEGATIVE
Ketones, ur: NEGATIVE mg/dL
Leukocytes, UA: NEGATIVE
Protein, ur: NEGATIVE mg/dL
pH: 6 (ref 5.0–8.0)

## 2013-07-20 LAB — WET PREP, GENITAL
Trich, Wet Prep: NONE SEEN
Yeast Wet Prep HPF POC: NONE SEEN

## 2013-07-20 MED ORDER — METRONIDAZOLE 500 MG PO TABS: 500.0000 mg | ORAL_TABLET | Freq: Two times a day (BID) | ORAL | Status: DC

## 2013-07-20 MED ORDER — ONDANSETRON 8 MG PO TBDP
8.0000 mg | ORAL_TABLET | Freq: Once | ORAL | Status: DC
  Filled 2013-07-20: qty 1

## 2013-07-20 MED ORDER — PHENAZOPYRIDINE HCL 95 MG PO TABS: 95.0000 mg | ORAL_TABLET | Freq: Three times a day (TID) | ORAL | Status: DC | PRN

## 2013-07-20 NOTE — ED Provider Notes (Signed)
Medical screening examination/treatment/procedure(s) were performed by non-physician practitioner and as supervising physician I was immediately available for consultation/collaboration.  Loren Racer, MD 07/20/13 2117

## 2013-07-20 NOTE — ED Provider Notes (Signed)
CSN: [id number]     Arrival date & time 07/20/13  1603 History     First MD Initiated Contact with Patient 07/20/13 1613     Chief Complaint  Patient presents with  . Abdominal Pain   (Consider location/radiation/quality/duration/timing/severity/associated sxs/prior Treatment) HPI Comments: Patient is a 42 year old female who presents today with 3 weeks of malodorous urine. She was evaluated by an urgent care who placed her on Cipro for a urinary tract infection. She has had no change in the smell of her urine. She finished the full course of Cipro. She complains of suprapubic pressure that has been present for the past 3 weeks. It does not radiate. The Cipro did not improve these symptoms. She also reports that she had some vaginal bleeding one week ago that lasted 2 days. She had a hysterectomy in the past and is concerned about this. No fevers, chills, vomiting, dysuria, urinary urgency, urinary frequency, vaginal discharge.   The history is provided by the patient. No language interpreter was used.    Past Medical History  Diagnosis Date  . Anxiety   . Renal disorder   . Stroke    Past Surgical History  Procedure Laterality Date  . Bladder tacking    . Abdominal hysterectomy     No family history on file. History  Substance Use Topics  . Smoking status: Never Smoker   . Smokeless tobacco: Never Used  . Alcohol Use: No   OB History   Grav Para Term Preterm Abortions TAB SAB Ect Mult Living                 Review of Systems  Constitutional: Negative for fever and chills.  Respiratory: Negative for shortness of breath.   Cardiovascular: Negative for chest pain.  Gastrointestinal: Positive for nausea. Negative for vomiting and abdominal pain.  Genitourinary: Positive for vaginal bleeding and pelvic pain. Negative for dysuria, urgency and vaginal discharge.  All other systems reviewed and are negative.    Allergies  Amoxicillin and Penicillins  Home Medications    Current Outpatient Rx  Name  Route  Sig  Dispense  Refill  . lamoTRIgine (LAMICTAL) 25 MG tablet   Oral   Take 25 mg by mouth 2 (two) times daily.         . methocarbamol (ROBAXIN) 500 MG tablet   Oral   Take 1 tablet (500 mg total) by mouth 2 (two) times daily.   20 tablet   0   . naproxen (NAPROSYN) 500 MG tablet   Oral   Take 1 tablet (500 mg total) by mouth 2 (two) times daily.   30 tablet   0   . perphenazine (TRILAFON) 4 MG tablet   Oral   Take 4 mg by mouth at bedtime.         . sertraline (ZOLOFT) 100 MG tablet   Oral   Take 100 mg by mouth daily.         . traZODone (DESYREL) 100 MG tablet   Oral   Take 150 mg by mouth daily.          BP 130/79  Pulse 95  Temp(Src) 99.4 F (37.4 C) (Oral)  Resp 16  SpO2 99% Physical Exam  Nursing note and vitals reviewed. Constitutional: She is oriented to person, place, and time. She appears well-developed and well-nourished. No distress.  HENT:  Head: Normocephalic and atraumatic.  Right Ear: External ear normal.  Left Ear: External ear normal.  Nose: Nose  normal.  Mouth/Throat: Oropharynx is clear and moist.  Eyes: Conjunctivae are normal.  Neck: Normal range of motion.  Cardiovascular: Normal rate, regular rhythm and normal heart sounds.   Pulmonary/Chest: Effort normal and breath sounds normal. No stridor. No respiratory distress. She has no wheezes. She has no rales.  Abdominal: Soft. She exhibits no distension. There is tenderness in the suprapubic area. There is no rigidity, no rebound, no guarding and no CVA tenderness. Hernia confirmed negative in the right inguinal area and confirmed negative in the left inguinal area.  Genitourinary: Vagina normal. No labial fusion. There is no rash, tenderness, lesion or injury on the right labia. There is no rash, tenderness, lesion or injury on the left labia. Cervix exhibits no motion tenderness, no discharge and no friability. Right adnexum displays no mass, no  tenderness and no fullness. Left adnexum displays no mass, no tenderness and no fullness. No erythema, tenderness or bleeding around the vagina. No foreign body around the vagina. No signs of injury around the vagina. No vaginal discharge found.  ttp suprapubicly over bladder.   Musculoskeletal: Normal range of motion.  Lymphadenopathy:       Right: No inguinal adenopathy present.       Left: No inguinal adenopathy present.  Neurological: She is alert and oriented to person, place, and time. She has normal strength.  Skin: Skin is warm and dry. She is not diaphoretic. No erythema.  Psychiatric: She has a normal mood and affect. Her behavior is normal.    ED Course   Procedures (including critical care time)  Labs Reviewed  WET PREP, GENITAL - Abnormal; Notable for the following:    Clue Cells Wet Prep HPF POC FEW (*)    WBC, Wet Prep HPF POC FEW (*)    All other components within normal limits  URINALYSIS, ROUTINE W REFLEX MICROSCOPIC - Abnormal; Notable for the following:    APPearance CLOUDY (*)    All other components within normal limits  CBC WITH DIFFERENTIAL - Abnormal; Notable for the following:    Lymphocytes Relative 47 (*)    All other components within normal limits  BASIC METABOLIC PANEL - Abnormal; Notable for the following:    Glucose, Bld 110 (*)    GFR calc non Af Amer 77 (*)    GFR calc Af Amer 89 (*)    All other components within normal limits  GC/CHLAMYDIA PROBE AMP   No results found. 1. Bacterial vaginosis     MDM  Patient presents with bacterial vaginosis. Will d/c with flagyl. Hx of urinary tract infection. She was treated at an urgent care with Cipro. Today urine shows no Leukocytes or nitrates. Urine will be sent for culture and patient will be called with positive result. This was discussed with patient. Will d/c with rx for azo for urinary pressure and irritation. Patient is afebrile without CVA tenderness in ED. Abdomen soft, non surgical. Return  instructions given. Vital signs stable for discharge. Discussed case with Dr. Ranae Palms who agrees with plan. Patient / Family / Caregiver informed of clinical course, understand medical decision-making process, and agree with plan.   Mora Bellman, PA-C 07/20/13 [phone]

## 2013-07-20 NOTE — ED Notes (Signed)
Pt c/o abd pain x2 weeks and nausea.  Reports scant amount of blood in urine and strong odor with urine frequency and burning sensation on urination.  Pt also reports lower back pain. Pt reports she was diagnosed with UTI 3 weeks ago, was given medication.

## 2014-03-19 ENCOUNTER — Other Ambulatory Visit: Payer: Self-pay | Admitting: Family Medicine

## 2014-03-19 DIAGNOSIS — Z1231 Encounter for screening mammogram for malignant neoplasm of breast: Secondary | ICD-10-CM

## 2014-03-26 ENCOUNTER — Ambulatory Visit
Admission: RE | Admit: 2014-03-26 | Discharge: 2014-03-26 | Disposition: A | Discharge status: Home / Self Care | Payer: BC Managed Care – PPO | Source: Ambulatory Visit | Attending: Family Medicine | Admitting: Family Medicine

## 2014-03-26 DIAGNOSIS — Z1231 Encounter for screening mammogram for malignant neoplasm of breast: Secondary | ICD-10-CM

## 2014-04-17 ENCOUNTER — Emergency Department (HOSPITAL_COMMUNITY)
Admission: EM | Admit: 2014-04-17 | Discharge: 2014-04-17 | Disposition: A | Discharge status: Home / Self Care | Payer: BC Managed Care – PPO | Attending: Emergency Medicine | Admitting: Emergency Medicine

## 2014-04-17 ENCOUNTER — Encounter (HOSPITAL_COMMUNITY): Payer: Self-pay | Admitting: Emergency Medicine

## 2014-04-17 DIAGNOSIS — F411 Generalized anxiety disorder: Secondary | ICD-10-CM | POA: Insufficient documentation

## 2014-04-17 DIAGNOSIS — M549 Dorsalgia, unspecified: Secondary | ICD-10-CM

## 2014-04-17 DIAGNOSIS — Z8673 Personal history of transient ischemic attack (TIA), and cerebral infarction without residual deficits: Secondary | ICD-10-CM | POA: Insufficient documentation

## 2014-04-17 DIAGNOSIS — Z87448 Personal history of other diseases of urinary system: Secondary | ICD-10-CM | POA: Insufficient documentation

## 2014-04-17 DIAGNOSIS — M545 Low back pain, unspecified: Secondary | ICD-10-CM | POA: Insufficient documentation

## 2014-04-17 DIAGNOSIS — R209 Unspecified disturbances of skin sensation: Secondary | ICD-10-CM | POA: Insufficient documentation

## 2014-04-17 DIAGNOSIS — Z79899 Other long term (current) drug therapy: Secondary | ICD-10-CM | POA: Insufficient documentation

## 2014-04-17 DIAGNOSIS — Z880 Allergy status to penicillin: Secondary | ICD-10-CM | POA: Insufficient documentation

## 2014-04-17 LAB — URINALYSIS, ROUTINE W REFLEX MICROSCOPIC
Bilirubin Urine: NEGATIVE
Glucose, UA: NEGATIVE mg/dL
Hgb urine dipstick: NEGATIVE
Ketones, ur: NEGATIVE mg/dL
Leukocytes, UA: NEGATIVE
Nitrite: NEGATIVE
Protein, ur: NEGATIVE mg/dL
Specific Gravity, Urine: 1.028 (ref 1.005–1.030)
Urobilinogen, UA: 0.2 mg/dL (ref 0.0–1.0)
pH: 6.5 (ref 5.0–8.0)

## 2014-04-17 LAB — CBG MONITORING, ED: Glucose-Capillary: 99 mg/dL (ref 70–99)

## 2014-04-17 MED ORDER — OXYCODONE-ACETAMINOPHEN 5-325 MG PO TABS: 1.0000 | ORAL_TABLET | ORAL | Status: DC | PRN

## 2014-04-17 MED ORDER — DIAZEPAM 5 MG PO TABS: 5.0000 mg | ORAL_TABLET | Freq: Three times a day (TID) | ORAL | Status: DC | PRN

## 2014-04-17 NOTE — ED Notes (Signed)
Pt c/o mid back pain that been going on for 2 weeks and pain radiates down left leg. Pt also c/o numbness in bilat hands. Pt also c/o feeling fatigued.

## 2014-04-17 NOTE — Discharge Instructions (Signed)
Back Pain, Adult Low back pain is very common. About 1 in 5 people have back pain.The cause of low back pain is rarely dangerous. The pain often gets better over time.About half of people with a sudden onset of back pain feel better in just 2 weeks. About 8 in 10 people feel better by 6 weeks.  CAUSES Some common causes of back pain include:  Strain of the muscles or ligaments supporting the spine.  Wear and tear (degeneration) of the spinal discs.  Arthritis.  Direct injury to the back. DIAGNOSIS Most of the time, the direct cause of low back pain is not known.However, back pain can be treated effectively even when the exact cause of the pain is unknown.Answering your caregiver's questions about your overall health and symptoms is one of the most accurate ways to make sure the cause of your pain is not dangerous. If your caregiver needs more information, he or she may order lab work or imaging tests (X-rays or MRIs).However, even if imaging tests show changes in your back, this usually does not require surgery. HOME CARE INSTRUCTIONS For many people, back pain returns.Since low back pain is rarely dangerous, it is often a condition that people can learn to manageon their own.   Remain active. It is stressful on the back to sit or stand in one place. Do not sit, drive, or stand in one place for more than 30 minutes at a time. Take short walks on level surfaces as soon as pain allows.Try to increase the length of time you walk each day.  Do not stay in bed.Resting more than 1 or 2 days can delay your recovery.  Do not avoid exercise or work.Your body is made to move.It is not dangerous to be active, even though your back may hurt.Your back will likely heal faster if you return to being active before your pain is gone.  Pay attention to your body when you bend and lift. Many people have less discomfortwhen lifting if they bend their knees, keep the load close to their bodies,and  avoid twisting. Often, the most comfortable positions are those that put less stress on your recovering back.  Find a comfortable position to sleep. Use a firm mattress and lie on your side with your knees slightly bent. If you lie on your back, put a pillow under your knees.  Only take over-the-counter or prescription medicines as directed by your caregiver. Over-the-counter medicines to reduce pain and inflammation are often the most helpful.Your caregiver may prescribe muscle relaxant drugs.These medicines help dull your pain so you can more quickly return to your normal activities and healthy exercise.  Put ice on the injured area.  Put ice in a plastic bag.  Place a towel between your skin and the bag.  Leave the ice on for 15-20 minutes, 03-04 times a day for the first 2 to 3 days. After that, ice and heat may be alternated to reduce pain and spasms.  Ask your caregiver about trying back exercises and gentle massage. This may be of some benefit.  Avoid feeling anxious or stressed.Stress increases muscle tension and can worsen back pain.It is important to recognize when you are anxious or stressed and learn ways to manage it.Exercise is a great option. SEEK MEDICAL CARE IF:  You have pain that is not relieved with rest or medicine.  You have pain that does not improve in 1 week.  You have new symptoms.  You are generally not feeling well. SEEK   IMMEDIATE MEDICAL CARE IF:   You have pain that radiates from your back into your legs.  You develop new bowel or bladder control problems.  You have unusual weakness or numbness in your arms or legs.  You develop nausea or vomiting.  You develop abdominal pain.  You feel faint. Document Released: 12/07/2005 Document Revised: 06/07/2012 Document Reviewed: 04/27/2011 [employer] Patient Information 2014 [employer].  

## 2014-04-29 NOTE — ED Provider Notes (Signed)
CSN: [id number]     Arrival date & time 04/17/14  1525 History   First MD Initiated Contact with Patient 04/17/14 1639     Chief Complaint  Patient presents with  . Back Pain  . hand numbness      (Consider location/radiation/quality/duration/timing/severity/associated sxs/prior Treatment) HPI  43 year old female with back pain. Gradual onset about 2 weeks ago. No trauma. Pain is lower back and sometimes radiates down to the left leg. No fevers or chills. No urinary complaints. No history of any back surgery. No blood thinners. Denies any IV drug use.  Past Medical History  Diagnosis Date  . Anxiety   . Renal disorder   . Stroke    Past Surgical History  Procedure Laterality Date  . Bladder tacking    . Abdominal hysterectomy     No family history on file. History  Substance Use Topics  . Smoking status: Never Smoker   . Smokeless tobacco: Never Used  . Alcohol Use: No   OB History   Grav Para Term Preterm Abortions TAB SAB Ect Mult Living                 Review of Systems  All systems reviewed and negative, other than as noted in HPI.   Allergies  Amoxicillin and Penicillins  Home Medications   Prior to Admission medications   Medication Sig Start Date End Date Taking? Authorizing Provider  acetaminophen-codeine (TYLENOL #3) 300-30 MG per tablet Take 1 tablet by mouth every 4 (four) hours as needed for moderate pain.   Yes Historical Provider, MD  Cyanocobalamin (B-12 PO) Take 1 tablet by mouth daily.    Yes Historical Provider, MD  fexofenadine (ALLEGRA) 180 MG tablet Take 180 mg by mouth daily.   Yes Historical Provider, MD  lamoTRIgine (LAMICTAL) 100 MG tablet Take 100 mg by mouth daily.   Yes Historical Provider, MD  methylphenidate (RITALIN) 20 MG tablet Take 20 mg by mouth 2 (two) times daily.   Yes Historical Provider, MD  Multiple Vitamin (MULTIVITAMIN WITH MINERALS) TABS Take 1 tablet by mouth daily.   Yes Historical Provider, MD  omega-3 acid ethyl  esters (LOVAZA) 1 G capsule Take 1 g by mouth daily.   Yes Historical Provider, MD  VITAMIN E PO Take 1 capsule by mouth daily.    Yes Historical Provider, MD  zolpidem (AMBIEN) 10 MG tablet Take 10 mg by mouth at bedtime as needed for sleep.   Yes Historical Provider, MD  diazepam (VALIUM) 5 MG tablet Take 1 tablet (5 mg total) by mouth every 8 (eight) hours as needed for anxiety or muscle spasms. 04/17/14   Raeford RazorStephen Nazier Neyhart, MD  oxyCODONE-acetaminophen (PERCOCET/ROXICET) 5-325 MG per tablet Take 1-2 tablets by mouth every 4 (four) hours as needed for severe pain. 04/17/14   Raeford RazorStephen Wrangler Penning, MD   BP 119/64  Pulse 81  Temp(Src) 98.6 F (37 C) (Oral)  Resp 18  SpO2 99% Physical Exam  Nursing note and vitals reviewed. Constitutional: She appears well-developed and well-nourished. No distress.  HENT:  Head: Normocephalic and atraumatic.  Eyes: Conjunctivae are normal. Right eye exhibits no discharge. Left eye exhibits no discharge.  Neck: Neck supple.  Cardiovascular: Normal rate, regular rhythm and normal heart sounds.  Exam reveals no gallop and no friction rub.   No murmur heard. Pulmonary/Chest: Effort normal and breath sounds normal. No respiratory distress.  Abdominal: Soft. She exhibits no distension. There is no tenderness.  Musculoskeletal: She exhibits no edema and no  tenderness.       Arms: Mild tenderness in the depicted areas.  Neurological: She is alert. No cranial nerve deficit. She exhibits normal muscle tone. Coordination normal.  Strength 5 out of 5 bilateral lower extremity. Sensation is intact to light touch. Patellar reflexes 1+ bilaterally.  Skin: Skin is warm and dry.  Psychiatric: She has a normal mood and affect. Her behavior is normal. Thought content normal.    ED Course  Procedures (including critical care time) Labs Review Labs Reviewed  URINALYSIS, ROUTINE W REFLEX MICROSCOPIC  CBG MONITORING, ED    Imaging Review No results found.   EKG  Interpretation None      MDM   Final diagnoses:  Back pain    43yF with back pain. Atraumatic. Non focal neuro exam. No blood thinning medications. No bladder/bowel incontinence or retention. Denies hx of IV drug use. Doubt cauda equina, spinal epidural abscess, spinal epidural hematoma, fracture, vertebral osteomyelitis/discitis or other potential emergent etiology. No indication for emergent imaging.  Plan symptomatic tx. Return precautions discussed. Outpt FU otherwise.     Raeford RazorStephen Mehtaab Mayeda, MD 04/29/14 1745

## 2014-09-24 ENCOUNTER — Emergency Department (HOSPITAL_COMMUNITY)
Admission: EM | Admit: 2014-09-24 | Discharge: 2014-09-24 | Disposition: A | Discharge status: Home / Self Care | Payer: BC Managed Care – PPO | Attending: Emergency Medicine | Admitting: Emergency Medicine

## 2014-09-24 ENCOUNTER — Encounter (HOSPITAL_COMMUNITY): Payer: Self-pay | Admitting: Emergency Medicine

## 2014-09-24 ENCOUNTER — Emergency Department (HOSPITAL_COMMUNITY): Payer: BC Managed Care – PPO

## 2014-09-24 DIAGNOSIS — F419 Anxiety disorder, unspecified: Secondary | ICD-10-CM | POA: Diagnosis not present

## 2014-09-24 DIAGNOSIS — R3589 Other polyuria: Secondary | ICD-10-CM

## 2014-09-24 DIAGNOSIS — Z8742 Personal history of other diseases of the female genital tract: Secondary | ICD-10-CM | POA: Insufficient documentation

## 2014-09-24 DIAGNOSIS — R631 Polydipsia: Secondary | ICD-10-CM | POA: Diagnosis not present

## 2014-09-24 DIAGNOSIS — M545 Low back pain: Secondary | ICD-10-CM | POA: Diagnosis present

## 2014-09-24 DIAGNOSIS — Z79899 Other long term (current) drug therapy: Secondary | ICD-10-CM | POA: Diagnosis not present

## 2014-09-24 DIAGNOSIS — Z3202 Encounter for pregnancy test, result negative: Secondary | ICD-10-CM | POA: Insufficient documentation

## 2014-09-24 DIAGNOSIS — Z880 Allergy status to penicillin: Secondary | ICD-10-CM | POA: Diagnosis not present

## 2014-09-24 DIAGNOSIS — Z8673 Personal history of transient ischemic attack (TIA), and cerebral infarction without residual deficits: Secondary | ICD-10-CM | POA: Diagnosis not present

## 2014-09-24 DIAGNOSIS — M5416 Radiculopathy, lumbar region: Secondary | ICD-10-CM | POA: Diagnosis not present

## 2014-09-24 DIAGNOSIS — R358 Other polyuria: Secondary | ICD-10-CM | POA: Insufficient documentation

## 2014-09-24 LAB — URINALYSIS, ROUTINE W REFLEX MICROSCOPIC
BILIRUBIN URINE: NEGATIVE
Glucose, UA: NEGATIVE mg/dL
HGB URINE DIPSTICK: NEGATIVE
Ketones, ur: NEGATIVE mg/dL
Leukocytes, UA: NEGATIVE
NITRITE: NEGATIVE
PROTEIN: NEGATIVE mg/dL
Specific Gravity, Urine: 1.018 (ref 1.005–1.030)
UROBILINOGEN UA: 0.2 mg/dL (ref 0.0–1.0)
pH: 5.5 (ref 5.0–8.0)

## 2014-09-24 LAB — BASIC METABOLIC PANEL
Anion gap: 14 (ref 5–15)
BUN: 11 mg/dL (ref 6–23)
CALCIUM: 9.1 mg/dL (ref 8.4–10.5)
CHLORIDE: 103 meq/L (ref 96–112)
CO2: 19 mEq/L (ref 19–32)
CREATININE: 0.71 mg/dL (ref 0.50–1.10)
GFR calc non Af Amer: >90 mL/min (ref >90)
Glucose, Bld: 93 mg/dL (ref 70–99)
Potassium: 4.2 mEq/L (ref 3.7–5.3)
Sodium: 136 mEq/L — ABNORMAL LOW (ref 137–147)

## 2014-09-24 LAB — CBC
HCT: 41 % (ref 36.0–46.0)
Hemoglobin: 13.8 g/dL (ref 12.0–15.0)
MCH: 29.6 pg (ref 26.0–34.0)
MCHC: 33.7 g/dL (ref 30.0–36.0)
MCV: 88 fL (ref 78.0–100.0)
Platelets: 178 10*3/uL (ref 150–400)
RBC: 4.66 MIL/uL (ref 3.87–5.11)
RDW: 15 % (ref 11.5–15.5)
WBC: 4.7 10*3/uL (ref 4.0–10.5)

## 2014-09-24 LAB — POC URINE PREG, ED: Preg Test, Ur: NEGATIVE

## 2014-09-24 LAB — CBG MONITORING, ED: Glucose-Capillary: 94 mg/dL (ref 70–99)

## 2014-09-24 MED ORDER — HYDROCODONE-ACETAMINOPHEN 5-325 MG PO TABS
2.0000 | ORAL_TABLET | Freq: Once | ORAL | Status: AC
  Administered 2014-09-24: 2 via ORAL
  Filled 2014-09-24: qty 2

## 2014-09-24 MED ORDER — CYCLOBENZAPRINE HCL 10 MG PO TABS: 10.0000 mg | ORAL_TABLET | Freq: Two times a day (BID) | ORAL | Status: DC | PRN

## 2014-09-24 NOTE — ED Provider Notes (Signed)
Medical screening examination/treatment/procedure(s) were performed by non-physician practitioner and as supervising physician I was immediately available for consultation/collaboration.   EKG Interpretation None       Devyon Keator R. Edgar Reisz, MD 09/24/14 1536 

## 2014-09-24 NOTE — ED Notes (Signed)
Patient transported to X-ray 

## 2014-09-24 NOTE — ED Notes (Signed)
Pt c/o lower back pain, right arm and left leg numbness x a couple weeks. Pt also c/o abd pain and has been urinating a lot.

## 2014-09-24 NOTE — ED Provider Notes (Signed)
CSN: [id number]     Arrival date & time 09/24/14  1008 History   First MD Initiated Contact with Patient 09/24/14 1045     Chief Complaint  Patient presents with  . Back Pain     (Consider location/radiation/quality/duration/timing/severity/associated sxs/prior Treatment) HPI Comments: This is a 43 year old female with a past medical history of anxiety and renal disorder who presents to the emergency department with multiple complaints. First, patient is complaining of lower back pain that has been present "for a while", worsening over the past few weeks. Pain described as pressure radiating across her lower back and down her left leg. Admits to associated occasional numbness or tingling. No aggravating or alleviating factors. Denies loss of control of bowels or bladder or saddle anesthesia. No known injury or trauma. She is also reporting intermittent right arm numbness and tingling for the past couple of weeks, worse when she is laying in bed. Patient is also endorsing increased urination, thirst and hunger. States she is craving sweets. 3 months ago she saw her PCP and was told she was borderline prediabetic. Denies dysuria or hematuria. Denies chest pain, shortness of breath, nausea or vomiting.  Patient is a 43 y.o. female presenting with back pain. The history is provided by the patient.  Back Pain Associated symptoms: numbness     Past Medical History  Diagnosis Date  . Anxiety   . Renal disorder   . Stroke    Past Surgical History  Procedure Laterality Date  . Bladder tacking    . Abdominal hysterectomy     No family history on file. History  Substance Use Topics  . Smoking status: Never Smoker   . Smokeless tobacco: Never Used  . Alcohol Use: No   OB History   Grav Para Term Preterm Abortions TAB SAB Ect Mult Living                 Review of Systems  Endocrine: Positive for polydipsia, polyphagia and polyuria.  Musculoskeletal: Positive for back pain.   Neurological: Positive for numbness.  All other systems reviewed and are negative.     Allergies  Amoxicillin and Penicillins  Home Medications   Prior to Admission medications   Medication Sig Start Date End Date Taking? Authorizing Provider  Cyanocobalamin (B-12 PO) Take 1 tablet by mouth daily.    Yes Historical Provider, MD  lamoTRIgine (LAMICTAL) 100 MG tablet Take 100 mg by mouth daily.   Yes Historical Provider, MD  methylphenidate (RITALIN) 20 MG tablet Take 20 mg by mouth 2 (two) times daily.   Yes Historical Provider, MD  VITAMIN E PO Take 1 capsule by mouth daily.    Yes Historical Provider, MD  zolpidem (AMBIEN) 10 MG tablet Take 10 mg by mouth at bedtime as needed for sleep.   Yes Historical Provider, MD  cyclobenzaprine (FLEXERIL) 10 MG tablet Take 1 tablet (10 mg total) by mouth 2 (two) times daily as needed for muscle spasms. 09/24/14   Trevor Mace, PA-C   BP 130/81  Pulse 70  Temp(Src) 98.2 F (36.8 C) (Oral)  Resp 18  SpO2 98% Physical Exam  Nursing note and vitals reviewed. Constitutional: She is oriented to person, place, and time. She appears well-developed and well-nourished. No distress.  HENT:  Head: Normocephalic and atraumatic.  Mouth/Throat: Oropharynx is clear and moist.  Eyes: Conjunctivae are normal.  Neck: Normal range of motion. Neck supple. No spinous process tenderness and no muscular tenderness present.  Cardiovascular: Normal rate, regular  rhythm and normal heart sounds.   Pulmonary/Chest: Effort normal and breath sounds normal. No respiratory distress.  Abdominal: Soft. Bowel sounds are normal. She exhibits no distension. There is no tenderness.  No CVAT.  Musculoskeletal: She exhibits no edema.  Tenderness to palpation across lower back. No specific focal tenderness.  Neurological: She is alert and oriented to person, place, and time. She has normal strength.  Strength lower extremities 5/5 and equal bilateral. Sensation  intact. Normal gait.  Skin: Skin is warm and dry. No rash noted. She is not diaphoretic.  Psychiatric: She has a normal mood and affect. Her behavior is normal.    ED Course  Procedures (including critical care time) Labs Review Labs Reviewed  BASIC METABOLIC PANEL - Abnormal; Notable for the following:    Sodium 136 (*)    All other components within normal limits  URINALYSIS, ROUTINE W REFLEX MICROSCOPIC - Abnormal; Notable for the following:    APPearance CLOUDY (*)    All other components within normal limits  CBC  POC URINE PREG, ED  CBG MONITORING, ED    Imaging Review Dg Lumbar Spine Complete  09/24/2014   CLINICAL DATA:  Mid low back pain radiating down both legs for couple months, worsening a couple weeks ago, no known trauma  EXAM: LUMBAR SPINE - COMPLETE 4+ VIEW  COMPARISON:  CT abdomen and pelvis 03/16/2013  FINDINGS: Five non-rib-bearing lumbar vertebrae.  Vertebral body and disc space heights maintained.  No acute fracture, subluxation or bone destruction.  No spondylolysis.  SI joints symmetric.  IMPRESSION: Normal exam.   Electronically Signed   By: Ulyses SouthwardMark  Boles M.D.   On: 09/24/2014 12:07     EKG Interpretation None      MDM   Final diagnoses:  Lumbar radiculopathy  Polyuria  Polydipsia   Pt with multiple complaints, she is non-toxic appearing and in NAD. AFVSS. No red flags concerning patient's back pain. No s/s of central cord compression or cauda equina. Lower extremities are neurovascularly intact and patient is ambulating without difficulty. Xray without any acute finding. Regarding paraesthesias on the right arm, possible radicular symptoms, especially given worse when laying in bed at night. Labs without any acute finding. Glucose WNL. Urine negative for infection. No acute symptoms. Will d/c with flexeril, advised NSAIDs, f/u with PCP. Return precautions given. Patient states understanding of treatment care plan and is agreeable.   Trevor MaceRobyn M Albert,  PA-C 09/24/14 1524

## 2014-09-24 NOTE — Discharge Instructions (Signed)
No driving or operating heavy machinery while taking flexeril. This medication may make you drowsy. Follow up with your primary care doctor.  Radicular Pain Radicular pain in either the arm or leg is usually from a bulging or herniated disk in the spine. A piece of the herniated disk may press against the nerves as the nerves exit the spine. This causes pain which is felt at the tips of the nerves down the arm or leg. Other causes of radicular pain may include:  Fractures.  Heart disease.  Cancer.  An abnormal and usually degenerative state of the nervous system or nerves (neuropathy). Diagnosis may require CT or MRI scanning to determine the primary cause.  Nerves that start at the neck (nerve roots) may cause radicular pain in the outer shoulder and arm. It can spread down to the thumb and fingers. The symptoms vary depending on which nerve root has been affected. In most cases radicular pain improves with conservative treatment. Neck problems may require physical therapy, a neck collar, or cervical traction. Treatment may take many weeks, and surgery may be considered if the symptoms do not improve.  Conservative treatment is also recommended for sciatica. Sciatica causes pain to radiate from the lower back or buttock area down the leg into the foot. Often there is a history of back problems. Most patients with sciatica are better after 2 to 4 weeks of rest and other supportive care. Short term bed rest can reduce the disk pressure considerably. Sitting, however, is not a good position since this increases the pressure on the disk. You should avoid bending, lifting, and all other activities which make the problem worse. Traction can be used in severe cases. Surgery is usually reserved for patients who do not improve within the first months of treatment. Only take over-the-counter or prescription medicines for pain, discomfort, or fever as directed by your caregiver. Narcotics and muscle relaxants  may help by relieving more severe pain and spasm and by providing mild sedation. Cold or massage can give significant relief. Spinal manipulation is not recommended. It can increase the degree of disc protrusion. Epidural steroid injections are often effective treatment for radicular pain. These injections deliver medicine to the spinal nerve in the space between the protective covering of the spinal cord and back bones (vertebrae). Your caregiver can give you more information about steroid injections. These injections are most effective when given within two weeks of the onset of pain.  You should see your caregiver for follow up care as recommended. A program for neck and back injury rehabilitation with stretching and strengthening exercises is an important part of management.  SEEK IMMEDIATE MEDICAL CARE IF:  You develop increased pain, weakness, or numbness in your arm or leg.  You develop difficulty with bladder or bowel control.  You develop abdominal pain. Document Released: 01/14/2005 Document Revised: 02/29/2012 Document Reviewed: 04/01/2009 [facility] Patient Information 2015 [place], [place]. This information is not intended to replace advice given to you by your health care provider. Make sure you discuss any questions you have with your health care provider.

## 2014-10-18 ENCOUNTER — Emergency Department (HOSPITAL_COMMUNITY)
Admission: EM | Admit: 2014-10-18 | Discharge: 2014-10-18 | Disposition: A | Discharge status: Home / Self Care | Payer: BC Managed Care – PPO | Attending: Emergency Medicine | Admitting: Emergency Medicine

## 2014-10-18 ENCOUNTER — Encounter (HOSPITAL_COMMUNITY): Payer: Self-pay | Admitting: Emergency Medicine

## 2014-10-18 DIAGNOSIS — R519 Headache, unspecified: Secondary | ICD-10-CM

## 2014-10-18 DIAGNOSIS — Z791 Long term (current) use of non-steroidal anti-inflammatories (NSAID): Secondary | ICD-10-CM | POA: Diagnosis not present

## 2014-10-18 DIAGNOSIS — F419 Anxiety disorder, unspecified: Secondary | ICD-10-CM | POA: Diagnosis not present

## 2014-10-18 DIAGNOSIS — Z87448 Personal history of other diseases of urinary system: Secondary | ICD-10-CM | POA: Insufficient documentation

## 2014-10-18 DIAGNOSIS — R300 Dysuria: Secondary | ICD-10-CM | POA: Diagnosis not present

## 2014-10-18 DIAGNOSIS — Z79899 Other long term (current) drug therapy: Secondary | ICD-10-CM | POA: Diagnosis not present

## 2014-10-18 DIAGNOSIS — R202 Paresthesia of skin: Secondary | ICD-10-CM | POA: Diagnosis not present

## 2014-10-18 DIAGNOSIS — R51 Headache: Secondary | ICD-10-CM | POA: Insufficient documentation

## 2014-10-18 DIAGNOSIS — Z8673 Personal history of transient ischemic attack (TIA), and cerebral infarction without residual deficits: Secondary | ICD-10-CM | POA: Insufficient documentation

## 2014-10-18 DIAGNOSIS — Z880 Allergy status to penicillin: Secondary | ICD-10-CM | POA: Insufficient documentation

## 2014-10-18 LAB — URINALYSIS, ROUTINE W REFLEX MICROSCOPIC
BILIRUBIN URINE: NEGATIVE
Glucose, UA: NEGATIVE mg/dL
Hgb urine dipstick: NEGATIVE
Ketones, ur: NEGATIVE mg/dL
Leukocytes, UA: NEGATIVE
Nitrite: NEGATIVE
PH: 6.5 (ref 5.0–8.0)
Protein, ur: NEGATIVE mg/dL
SPECIFIC GRAVITY, URINE: 1.024 (ref 1.005–1.030)
Urobilinogen, UA: 0.2 mg/dL (ref 0.0–1.0)

## 2014-10-18 MED ORDER — SUMATRIPTAN SUCCINATE 100 MG PO TABS: 100.0000 mg | ORAL_TABLET | ORAL | Status: DC | PRN

## 2014-10-18 MED ORDER — DIPHENHYDRAMINE HCL 50 MG/ML IJ SOLN: 25.0000 mg | INTRAMUSCULAR | Status: DC | PRN

## 2014-10-18 MED ORDER — HYDROMORPHONE HCL 1 MG/ML IJ SOLN
1.0000 mg | Freq: Once | INTRAMUSCULAR | Status: AC
  Administered 2014-10-18: 1 mg via INTRAVENOUS
  Filled 2014-10-18: qty 1

## 2014-10-18 MED ORDER — METOCLOPRAMIDE HCL 5 MG/ML IJ SOLN
10.0000 mg | Freq: Once | INTRAMUSCULAR | Status: AC
  Administered 2014-10-18: 10 mg via INTRAVENOUS
  Filled 2014-10-18: qty 2

## 2014-10-18 MED ORDER — DIPHENHYDRAMINE HCL 50 MG/ML IJ SOLN
25.0000 mg | Freq: Once | INTRAMUSCULAR | Status: AC
  Administered 2014-10-18: 25 mg via INTRAVENOUS
  Filled 2014-10-18: qty 1

## 2014-10-18 MED ORDER — KETOROLAC TROMETHAMINE 30 MG/ML IJ SOLN
30.0000 mg | Freq: Once | INTRAMUSCULAR | Status: AC
  Administered 2014-10-18: 30 mg via INTRAVENOUS
  Filled 2014-10-18: qty 1

## 2014-10-18 MED ORDER — SODIUM CHLORIDE 0.9 % IV SOLN
Freq: Once | INTRAVENOUS | Status: AC
  Administered 2014-10-18: 10:00:00 via INTRAVENOUS

## 2014-10-18 NOTE — Discharge Instructions (Signed)

## 2014-10-18 NOTE — ED Notes (Signed)
Karen, PA at the bedside.  

## 2014-10-18 NOTE — ED Provider Notes (Signed)
CSN: [id number]     Arrival date & time 10/18/14  [id number] History   First MD Initiated Contact with Patient 10/18/14 [phone]     Chief Complaint  Patient presents with  . Headache  . Dysuria  . Tingling     (Consider location/radiation/quality/duration/timing/severity/associated sxs/prior Treatment) Patient is a 43 y.o. female presenting with headaches. The history is provided by the patient. No language interpreter was used.  Headache Pain location:  Generalized Radiates to:  Does not radiate Severity currently:  9/10 Severity at highest:  9/10 Onset quality:  Gradual Timing:  Constant Progression:  Worsening Chronicity:  New Similar to prior headaches: no   Context: not activity   Relieved by:  None tried Worsened by:  Nothing tried Ineffective treatments:  None tried Associated symptoms: no abdominal pain, no facial pain, no fever and no focal weakness   Risk factors: no anger     Past Medical History  Diagnosis Date  . Anxiety   . Renal disorder   . Stroke    Past Surgical History  Procedure Laterality Date  . Bladder tacking    . Abdominal hysterectomy     History reviewed. No pertinent family history. History  Substance Use Topics  . Smoking status: Never Smoker   . Smokeless tobacco: Never Used  . Alcohol Use: No   OB History   Grav Para Term Preterm Abortions TAB SAB Ect Mult Living                 Review of Systems  Constitutional: Negative for fever.  Gastrointestinal: Negative for abdominal pain.  Neurological: Positive for headaches. Negative for focal weakness.  All other systems reviewed and are negative.     Allergies  Amoxicillin and Penicillins  Home Medications   Prior to Admission medications   Medication Sig Start Date End Date Taking? Authorizing Provider  acetaminophen (TYLENOL) 500 MG tablet Take 1,000 mg by mouth every 6 (six) hours as needed for headache.   Yes Historical Provider, MD  Cyanocobalamin (B-12 PO) Take 5 mLs by  mouth daily.    Yes Historical Provider, MD  lamoTRIgine (LAMICTAL) 100 MG tablet Take 100 mg by mouth daily.   Yes Historical Provider, MD  methylphenidate (RITALIN) 20 MG tablet Take 20 mg by mouth 2 (two) times daily.   Yes Historical Provider, MD  naproxen sodium (ANAPROX) 220 MG tablet Take 220 mg by mouth 2 (two) times daily as needed (headache).   Yes Historical Provider, MD  VITAMIN E PO Take 2 capsules by mouth daily.    Yes Historical Provider, MD  zolpidem (AMBIEN) 10 MG tablet Take 10 mg by mouth at bedtime as needed for sleep.   Yes Historical Provider, MD   BP 117/74  Pulse 76  Temp(Src) 98.3 F (36.8 C) (Oral)  Resp 19  Ht 5\' 4"  (1.626 m)  Wt 200 lb (90.719 kg)  BMI 34.31 kg/m2  SpO2 98% Physical Exam  Nursing note and vitals reviewed. Constitutional: She is oriented to person, place, and time. She appears well-developed and well-nourished.  HENT:  Head: Normocephalic.  Right Ear: External ear normal.  Left Ear: External ear normal.  Eyes: EOM are normal. Pupils are equal, round, and reactive to light.  Neck: Normal range of motion. Neck supple.  Cardiovascular: Normal rate, regular rhythm and normal heart sounds.   Pulmonary/Chest: Effort normal and breath sounds normal.  Abdominal: Soft.  Musculoskeletal: Normal range of motion.  Neurological: She is alert and oriented to person,  place, and time. She has normal reflexes.  Skin: Skin is warm.  Psychiatric: She has a normal mood and affect.    ED Course  Procedures (including critical care time) Labs Review Labs Reviewed  URINALYSIS, ROUTINE W REFLEX MICROSCOPIC    Imaging Review No results found.   EKG Interpretation None      MDM   Final diagnoses:  Acute nonintractable headache, unspecified headache type   Pt reports some relief with reglan, benadryl and torodol,  Pt given dilaudid Iv.   Pt reports headache resolved.    Lonia SkinnerLeslie K PalenvilleSofia, PA-C 10/18/14 1338

## 2014-10-18 NOTE — ED Notes (Signed)
Pt reports ha since Sunday, no help with otc meds, sts blood in urine and worried she may have an aneurysm

## 2014-10-18 NOTE — ED Provider Notes (Signed)
Medical screening examination/treatment/procedure(s) were performed by non-physician practitioner and as supervising physician I was immediately available for consultation/collaboration.  Cheyenne Schumm L Erico Stan, MD 10/18/14 1716 

## 2014-10-24 ENCOUNTER — Encounter (HOSPITAL_COMMUNITY): Payer: Self-pay | Admitting: Emergency Medicine

## 2014-10-24 ENCOUNTER — Emergency Department (HOSPITAL_COMMUNITY)
Admission: EM | Admit: 2014-10-24 | Discharge: 2014-10-25 | Disposition: A | Discharge status: Home / Self Care | Payer: BC Managed Care – PPO | Attending: Emergency Medicine | Admitting: Emergency Medicine

## 2014-10-24 DIAGNOSIS — R51 Headache: Secondary | ICD-10-CM | POA: Diagnosis not present

## 2014-10-24 DIAGNOSIS — Z791 Long term (current) use of non-steroidal anti-inflammatories (NSAID): Secondary | ICD-10-CM | POA: Insufficient documentation

## 2014-10-24 DIAGNOSIS — T398X5A Adverse effect of other nonopioid analgesics and antipyretics, not elsewhere classified, initial encounter: Secondary | ICD-10-CM | POA: Diagnosis not present

## 2014-10-24 DIAGNOSIS — Z880 Allergy status to penicillin: Secondary | ICD-10-CM | POA: Insufficient documentation

## 2014-10-24 DIAGNOSIS — Z7952 Long term (current) use of systemic steroids: Secondary | ICD-10-CM | POA: Insufficient documentation

## 2014-10-24 DIAGNOSIS — Z79899 Other long term (current) drug therapy: Secondary | ICD-10-CM | POA: Insufficient documentation

## 2014-10-24 DIAGNOSIS — Z87448 Personal history of other diseases of urinary system: Secondary | ICD-10-CM | POA: Insufficient documentation

## 2014-10-24 DIAGNOSIS — Z8673 Personal history of transient ischemic attack (TIA), and cerebral infarction without residual deficits: Secondary | ICD-10-CM | POA: Diagnosis not present

## 2014-10-24 DIAGNOSIS — Y9389 Activity, other specified: Secondary | ICD-10-CM | POA: Insufficient documentation

## 2014-10-24 DIAGNOSIS — R0981 Nasal congestion: Secondary | ICD-10-CM | POA: Insufficient documentation

## 2014-10-24 DIAGNOSIS — Y9289 Other specified places as the place of occurrence of the external cause: Secondary | ICD-10-CM | POA: Insufficient documentation

## 2014-10-24 DIAGNOSIS — R519 Headache, unspecified: Secondary | ICD-10-CM

## 2014-10-24 DIAGNOSIS — T50905A Adverse effect of unspecified drugs, medicaments and biological substances, initial encounter: Secondary | ICD-10-CM

## 2014-10-24 MED ORDER — METOCLOPRAMIDE HCL 10 MG PO TABS
10.0000 mg | ORAL_TABLET | Freq: Once | ORAL | Status: DC
  Filled 2014-10-24: qty 1

## 2014-10-24 MED ORDER — METOCLOPRAMIDE HCL 10 MG PO TABS
10.0000 mg | ORAL_TABLET | Freq: Once | ORAL | Status: AC
  Administered 2014-10-24: 10 mg via ORAL

## 2014-10-24 MED ORDER — DIPHENHYDRAMINE HCL 25 MG PO CAPS
25.0000 mg | ORAL_CAPSULE | Freq: Once | ORAL | Status: AC
  Administered 2014-10-24: 25 mg via ORAL
  Filled 2014-10-24: qty 1

## 2014-10-24 MED ORDER — METHYLPREDNISOLONE SODIUM SUCC 125 MG IJ SOLR
125.0000 mg | Freq: Once | INTRAMUSCULAR | Status: AC
  Administered 2014-10-24: 125 mg via INTRAMUSCULAR
  Filled 2014-10-24: qty 2

## 2014-10-24 NOTE — ED Notes (Signed)
Pt took sumatriptan around 1930, started having tightness in throat and chest, difficulty swallowing, and states that her skin feels like it's burning all over. Pt able to talk in complete sentences. Lung sounds clear.

## 2014-10-25 MED ORDER — KETOROLAC TROMETHAMINE 60 MG/2ML IM SOLN
60.0000 mg | Freq: Once | INTRAMUSCULAR | Status: AC
  Administered 2014-10-25: 60 mg via INTRAMUSCULAR
  Filled 2014-10-25: qty 2

## 2014-10-25 MED ORDER — LORATADINE 10 MG PO TABS: 10.0000 mg | ORAL_TABLET | Freq: Every day | ORAL | Status: DC

## 2014-10-25 MED ORDER — PREDNISONE 20 MG PO TABS: ORAL_TABLET | ORAL | Status: DC

## 2014-10-25 MED ORDER — OXYMETAZOLINE HCL 0.05 % NA SOLN: 1.0000 | Freq: Two times a day (BID) | NASAL | Status: DC

## 2014-10-25 MED ORDER — MOMETASONE FUROATE 50 MCG/ACT NA SUSP: 2.0000 | Freq: Every day | NASAL | Status: DC

## 2014-10-25 NOTE — ED Provider Notes (Signed)
CSN: [id number]     Arrival date & time 10/24/14  2245 History   First MD Initiated Contact with Patient 10/24/14 2326     Chief Complaint  Patient presents with  . Medication Reaction     (Consider location/radiation/quality/duration/timing/severity/associated sxs/prior Treatment) HPI Patient presents with throat tightness, difficulty swallowing and burning sensation over her skin that started around 7:30 this evening after taking sumatriptan for the first time. Patient has chronic headaches and was recently prescribed this medication. She's never taken before. She denied any new rash after taking the medication. No nausea or vomiting. No facial swelling. Daughter states that her symptoms come and go since taking the medication.patient's headache began earlier in the day. Was gradual onset. Similar to previous headaches. Past Medical History  Diagnosis Date  . Anxiety   . Renal disorder   . Stroke    Past Surgical History  Procedure Laterality Date  . Bladder tacking    . Abdominal hysterectomy     No family history on file. History  Substance Use Topics  . Smoking status: Never Smoker   . Smokeless tobacco: Never Used  . Alcohol Use: No   OB History    No data available     Review of Systems  Constitutional: Negative for fever and chills.  HENT: Positive for congestion, sinus pressure and trouble swallowing. Negative for facial swelling and voice change.   Respiratory: Negative for chest tightness, shortness of breath and wheezing.   Cardiovascular: Negative for chest pain.  Gastrointestinal: Negative for nausea and abdominal pain.  Musculoskeletal: Negative for back pain, neck pain and neck stiffness.  Skin: Negative for rash and wound.  Neurological: Positive for headaches. Negative for dizziness, weakness, light-headedness and numbness.  All other systems reviewed and are negative.     Allergies  Imitrex; Amoxicillin; and Penicillins  Home Medications   Prior  to Admission medications   Medication Sig Start Date End Date Taking? Authorizing Provider  lamoTRIgine (LAMICTAL) 100 MG tablet Take 100 mg by mouth daily.   Yes Historical Provider, MD  methylphenidate (RITALIN) 20 MG tablet Take 20 mg by mouth 2 (two) times daily.   Yes Historical Provider, MD  OVER THE COUNTER MEDICATION Take 1 tablet by mouth daily.   Yes Historical Provider, MD  VITAMIN E PO Take 2 capsules by mouth daily.    Yes Historical Provider, MD  zolpidem (AMBIEN) 10 MG tablet Take 10 mg by mouth at bedtime as needed for sleep.   Yes Historical Provider, MD  acetaminophen (TYLENOL) 500 MG tablet Take 1,000 mg by mouth every 6 (six) hours as needed for headache.    Historical Provider, MD  Cyanocobalamin (B-12 PO) Take 5 mLs by mouth daily.     Historical Provider, MD  loratadine (CLARITIN) 10 MG tablet Take 1 tablet (10 mg total) by mouth daily. 10/25/14   Loren Raceravid Landrey Mahurin, MD  mometasone (NASONEX) 50 MCG/ACT nasal spray Place 2 sprays into the nose daily. 10/25/14   Loren Raceravid Sye Schroepfer, MD  naproxen sodium (ANAPROX) 220 MG tablet Take 220 mg by mouth 2 (two) times daily as needed (headache).    Historical Provider, MD  oxymetazoline (AFRIN NASAL SPRAY) 0.05 % nasal spray Place 1 spray into both nostrils 2 (two) times daily. 10/25/14   Loren Raceravid Aristide Waggle, MD  predniSONE (DELTASONE) 20 MG tablet 3 tabs po day one, then 2 po daily x 4 days 10/25/14   Loren Raceravid Shakil Dirk, MD  SUMAtriptan (IMITREX) 100 MG tablet Take 1 tablet (100 mg total)  by mouth every 2 (two) hours as needed for migraine or headache. May repeat in 2 hours if headache persists or recurs. 10/18/14   Lonia SkinnerLeslie K Sofia, PA-C   BP 132/75 mmHg  Pulse 71  Temp(Src) 97.9 F (36.6 C) (Oral)  Resp 22  Ht 5\' 4"  (1.626 m)  Wt 190 lb (86.183 kg)  BMI 32.60 kg/m2  SpO2 100% Physical Exam  Constitutional: She is oriented to person, place, and time. She appears well-developed and well-nourished. No distress.  HENT:  Head: Normocephalic and  atraumatic.  Mouth/Throat: Oropharynx is clear and moist.  Bilateral maxillary sinus tenderness to percussion. Bilateral nasal mucosal edema. No intraoral swelling. No lip or facial swelling.  Eyes: EOM are normal. Pupils are equal, round, and reactive to light.  Neck: Normal range of motion. Neck supple.  No meningismus  Cardiovascular: Normal rate and regular rhythm.  Exam reveals no gallop and no friction rub.   No murmur heard. Pulmonary/Chest: Effort normal and breath sounds normal. No stridor. No respiratory distress. She has no wheezes. She has no rales.  Patient speaks in a clear voice.  Abdominal: Soft. Bowel sounds are normal. She exhibits no distension and no mass. There is no tenderness. There is no rebound and no guarding.  Musculoskeletal: Normal range of motion. She exhibits no edema or tenderness.  Neurological: She is alert and oriented to person, place, and time.  Patient is alert and oriented x3 with clear, goal oriented speech. Patient has 5/5 motor in all extremities. Sensation is intact to light touch. Bilateral finger-to-nose is normal with no signs of dysmetria. Patient has a normal gait and walks without assistance.  Skin: Skin is warm and dry. No rash noted. No erythema.  Psychiatric: She has a normal mood and affect. Her behavior is normal.  Nursing note and vitals reviewed.   ED Course  Procedures (including critical care time) Labs Review Labs Reviewed - No data to display  Imaging Review No results found.   EKG Interpretation None      MDM   Final diagnoses:  Adverse reaction to drug, initial encounter  Sinus headache   Patient states she is feeling much better. Continues to have facial pain likely due to sinusitis. Airway remains intact. Advised to discontinue taking sumatriptan. Likely patient had adverse reaction to medication. Does not appear like an allergic reaction. Return precautions have been given.     Loren Raceravid [place], MD 10/25/14  [id number]

## 2014-10-25 NOTE — Discharge Instructions (Signed)
Drug Allergy A drug allergy means you have a strange reaction to a medicine. You may have puffiness (swelling), itching, red rashes, and hives. Some allergic reactions can be life-threatening. HOME CARE  If you do not know what caused your reaction:  Write down medicines you use.  Write down any problems you have after using medicine.  Avoid things that cause a reaction.  You can see an allergy doctor to be tested for allergies. If you have hives or a rash:  Take medicine as told by your doctor.  Place cold cloths on your skin.  Do not take hot baths or hot showers. Take baths in cool water. If you are severely allergic:  Wear a medical bracelet or necklace that lists your allergy.  Carry your allergy kit or medicine shot to treat severe allergic reactions with you. These can save your life.  Do not drive until medicine from your shot has worn off, unless your doctor says it is okay. GET HELP RIGHT AWAY IF:   Your mouth is puffy, or you have trouble breathing.  You have a tight feeling in your chest or throat.  You have hives, puffiness, or itching all over your body.  You throw up (vomit) or have watery poop (diarrhea).  You feel dizzy or pass out (faint).  You think you are having a reaction. Problems often start within 30 minutes after taking a medicine.  You are getting worse, not better.  You have new problems.  Your problems go away and then come back. This is an emergency. Use your medicine shot or allergy kit as told. Call yourlocal emergency services (911 in [place] after the shot. Even if you feel better after the shot, you need to go to the hospital. You may need more medicine to control a severe reaction. MAKE SURE YOU:  Understand these instructions.  Will watch your condition.  Will get help right away if you are not doing well or get worse. Document Released: 01/14/2005 Document Revised: 02/29/2012 Document Reviewed: 06/04/2011 [facility] Patient  Information 2015 [place], [place]. This information is not intended to replace advice given to you by your health care provider. Make sure you discuss any questions you have with your health care provider.  Sinus Headache A sinus headache is when your sinuses become clogged or swollen. Sinus headaches can range from mild to severe.  CAUSES A sinus headache can have different causes, such as:  Colds.  Sinus infections.  Allergies. SYMPTOMS  Symptoms of a sinus headache may vary and can include:  Headache.  Pain or pressure in the face.  Congested or runny nose.  Fever.  Inability to smell.  Pain in upper teeth. Weather changes can make symptoms worse. TREATMENT  The treatment of a sinus headache depends on the cause.  Sinus pain caused by a sinus infection may be treated with antibiotic medicine.  Sinus pain caused by allergies may be helped by allergy medicines (antihistamines) and medicated nasal sprays.  Sinus pain caused by congestion may be helped by flushing the nose and sinuses with saline solution. HOME CARE INSTRUCTIONS   If antibiotics are prescribed, take them as directed. Finish them even if you start to feel better.  Only take over-the-counter or prescription medicines for pain, discomfort, or fever as directed by your caregiver.  If you have congestion, use a nasal spray to help reduce pressure. SEEK IMMEDIATE MEDICAL CARE IF:  You have a fever.  You have headaches more than once a week.  You  have sensitivity to light or sound.  You have repeated nausea and vomiting.  You have vision problems.  You have sudden, severe pain in your face or head.  You have a seizure.  You are confused.  Your sinus headaches do not get better after treatment. Many people think they have a sinus headache when they actually have migraines or tension headaches. MAKE SURE YOU:   Understand these instructions.  Will watch your condition.  Will get help right away  if you are not doing well or get worse. Document Released: 01/14/2005 Document Revised: 02/29/2012 Document Reviewed: 03/07/2011 [facility] Patient Information 2015 [place], [place]. This information is not intended to replace advice given to you by your health care provider. Make sure you discuss any questions you have with your health care provider.

## 2015-01-25 ENCOUNTER — Encounter (HOSPITAL_COMMUNITY): Payer: Self-pay | Admitting: Emergency Medicine

## 2015-01-25 ENCOUNTER — Emergency Department (HOSPITAL_COMMUNITY)
Admission: EM | Admit: 2015-01-25 | Discharge: 2015-01-25 | Disposition: A | Discharge status: Home / Self Care | Payer: BLUE CROSS/BLUE SHIELD | Attending: Emergency Medicine | Admitting: Emergency Medicine

## 2015-01-25 DIAGNOSIS — Z7951 Long term (current) use of inhaled steroids: Secondary | ICD-10-CM | POA: Insufficient documentation

## 2015-01-25 DIAGNOSIS — J029 Acute pharyngitis, unspecified: Secondary | ICD-10-CM | POA: Insufficient documentation

## 2015-01-25 DIAGNOSIS — Z791 Long term (current) use of non-steroidal anti-inflammatories (NSAID): Secondary | ICD-10-CM | POA: Insufficient documentation

## 2015-01-25 DIAGNOSIS — Z8673 Personal history of transient ischemic attack (TIA), and cerebral infarction without residual deficits: Secondary | ICD-10-CM | POA: Diagnosis not present

## 2015-01-25 DIAGNOSIS — F419 Anxiety disorder, unspecified: Secondary | ICD-10-CM | POA: Insufficient documentation

## 2015-01-25 DIAGNOSIS — Z79899 Other long term (current) drug therapy: Secondary | ICD-10-CM | POA: Diagnosis not present

## 2015-01-25 DIAGNOSIS — R61 Generalized hyperhidrosis: Secondary | ICD-10-CM | POA: Diagnosis not present

## 2015-01-25 DIAGNOSIS — Z87448 Personal history of other diseases of urinary system: Secondary | ICD-10-CM | POA: Diagnosis not present

## 2015-01-25 DIAGNOSIS — R0981 Nasal congestion: Secondary | ICD-10-CM | POA: Insufficient documentation

## 2015-01-25 DIAGNOSIS — R21 Rash and other nonspecific skin eruption: Secondary | ICD-10-CM | POA: Insufficient documentation

## 2015-01-25 LAB — RAPID STREP SCREEN (MED CTR MEBANE ONLY): Streptococcus, Group A Screen (Direct): NEGATIVE

## 2015-01-25 MED ORDER — HYDROXYZINE HCL 25 MG PO TABS: 25.0000 mg | ORAL_TABLET | Freq: Four times a day (QID) | ORAL | Status: DC

## 2015-01-25 MED ORDER — DEXAMETHASONE SODIUM PHOSPHATE 10 MG/ML IJ SOLN
10.0000 mg | Freq: Once | INTRAMUSCULAR | Status: AC
  Administered 2015-01-25: 10 mg via INTRAMUSCULAR
  Filled 2015-01-25: qty 1

## 2015-01-25 MED ORDER — FAMOTIDINE 20 MG PO TABS: 20.0000 mg | ORAL_TABLET | Freq: Two times a day (BID) | ORAL | Status: DC

## 2015-01-25 MED ORDER — DEXAMETHASONE SODIUM PHOSPHATE 10 MG/ML IJ SOLN: 20.0000 mg | Freq: Once | INTRAMUSCULAR | Status: DC

## 2015-01-25 MED ORDER — PREDNISONE 20 MG PO TABS: 40.0000 mg | ORAL_TABLET | Freq: Every day | ORAL | Status: DC

## 2015-01-25 NOTE — ED Provider Notes (Signed)
CSN: [id number]     Arrival date & time 01/25/15  0948 History  This chart was scribed for Jaynie Crumble, PA-C, working with Derwood Kaplan, MD by Chestine Spore, ED Scribe. The patient was seen in room TR06C/TR06C at 10:48 AM.    Chief Complaint  Patient presents with  . Allergic Reaction     The history is provided by the patient. No language interpreter was used.    HPI Comments: Emily Carrillo is a 44 y.o. female who presents to the Emergency Department complaining of allergic reaction onset 5 days. she denies the use of lotions, detergents. She notes that she uses [employer] and she just recently changed the scent that she uses. She reports that her son had strep throat and was recently treated for it. She reports that she had a fever Monday with the max being 102-103. She states that she is having associated symptoms of rash to chest and face, itching, burning, sore throat, congestion, diaphoretic. She states that she has tried vitamin E oil, benadryl, and a moisturizing cream with no relief for her symptoms. She has tried these remedies daily. She denies mouth lesions and any other symptoms. She denies breaking out during the summer. She notes that she was borderline DM and she does not check her blood sugar or take any medication for it.    Past Medical History  Diagnosis Date  . Anxiety   . Renal disorder   . Stroke    Past Surgical History  Procedure Laterality Date  . Bladder tacking    . Abdominal hysterectomy     No family history on file. History  Substance Use Topics  . Smoking status: Never Smoker   . Smokeless tobacco: Never Used  . Alcohol Use: No   OB History    No data available     Review of Systems  Constitutional: Positive for diaphoresis. Negative for fever.  HENT: Positive for congestion and sore throat. Negative for mouth sores.   Skin: Positive for rash.  All other systems reviewed and are negative.     Allergies  Imitrex; Amoxicillin; and  Penicillins  Home Medications   Prior to Admission medications   Medication Sig Start Date End Date Taking? Authorizing Provider  acetaminophen (TYLENOL) 500 MG tablet Take 1,000 mg by mouth every 6 (six) hours as needed for headache.    Historical Provider, MD  Cyanocobalamin (B-12 PO) Take 5 mLs by mouth daily.     Historical Provider, MD  lamoTRIgine (LAMICTAL) 100 MG tablet Take 100 mg by mouth daily.    Historical Provider, MD  loratadine (CLARITIN) 10 MG tablet Take 1 tablet (10 mg total) by mouth daily. 10/25/14   Loren Racer, MD  methylphenidate (RITALIN) 20 MG tablet Take 20 mg by mouth 2 (two) times daily.    Historical Provider, MD  mometasone (NASONEX) 50 MCG/ACT nasal spray Place 2 sprays into the nose daily. 10/25/14   Loren Racer, MD  naproxen sodium (ANAPROX) 220 MG tablet Take 220 mg by mouth 2 (two) times daily as needed (headache).    Historical Provider, MD  OVER THE COUNTER MEDICATION Take 1 tablet by mouth daily.    Historical Provider, MD  oxymetazoline (AFRIN NASAL SPRAY) 0.05 % nasal spray Place 1 spray into both nostrils 2 (two) times daily. 10/25/14   Loren Racer, MD  predniSONE (DELTASONE) 20 MG tablet 3 tabs po day one, then 2 po daily x 4 days 10/25/14   Loren Racer, MD  VITAMIN E PO Take 2 capsules by mouth daily.     Historical Provider, MD  zolpidem (AMBIEN) 10 MG tablet Take 10 mg by mouth at bedtime as needed for sleep.    Historical Provider, MD   BP 135/81 mmHg  Pulse 85  Temp(Src) 97.9 F (36.6 C) (Oral)  Resp 18  Ht 5\' 4"  (1.626 m)  Wt 190 lb (86.183 kg)  BMI 32.60 kg/m2  SpO2 100%  Physical Exam  Constitutional: She is oriented to person, place, and time. She appears well-developed and well-nourished. No distress.  HENT:  Head: Normocephalic and atraumatic.  Right Ear: Tympanic membrane and external ear normal.  Left Ear: Tympanic membrane and external ear normal.  Mouth/Throat: Oropharynx is clear and moist.  Eyes: EOM are  normal.  Neck: Neck supple. No tracheal deviation present.  Cardiovascular: Normal rate.   Pulmonary/Chest: Effort normal. No respiratory distress.  Musculoskeletal: Normal range of motion.  Lymphadenopathy:    She has cervical adenopathy (tenderness noted upon exam).  Neurological: She is alert and oriented to person, place, and time.  Skin: Skin is warm and dry. Rash noted. Rash is maculopapular. Rash is not vesicular. There is erythema.  Maculopapular rash noted to chest and face with mild erythema surrounding the skin and rash. No drainage noted.  Psychiatric: She has a normal mood and affect. Her behavior is normal.  Nursing note and vitals reviewed.   ED Course  Procedures (including critical care time) DIAGNOSTIC STUDIES: Oxygen Saturation is 100% on room air, normal by my interpretation.    COORDINATION OF CARE: 10:56 AM-Discussed treatment plan which includes rapid strep screen with pt at bedside and pt agreed to plan.   Labs Review Labs Reviewed  RAPID STREP SCREEN  CULTURE, GROUP A STREP    Imaging Review No results found.   EKG Interpretation None      MDM   Final diagnoses:  Rash   Patient with a rash to her chest and face. Rashes maculopapular, erythematous, no pustules or vesicles. There is no rash anywhere else on the body. Rash is itchy and burning. She cannot think of any new products, new foods, new medications. She has never had similar rash in the past. She is asking for something for itching right now. She has taken Benadryl with no relief. Decadron IM given in the emergency department for itching. Patient states that she was exposed to strep recently and still has some swollen lymph nodes, strep screen obtained and is negative. I still suspect her rash is most likely an allergic reaction to either product or new food. Will start her on a prednisone pack, Pepcid, Vistaril. Follow-up with primary care doctor or allergist.  Filed Vitals:   01/25/15 0957  01/25/15 1205  BP: 135/81 126/77  Pulse: 85 88  Temp: 97.9 F (36.6 C)   TempSrc: Oral   Resp: 18 18  Height: 5\' 4"  (1.626 m)   Weight: 190 lb (86.183 kg)   SpO2: 100% 97%     I personally performed the services described in this documentation, which was scribed in my presence. The recorded information has been reviewed and is accurate.    Lottie Musselatyana A Keith Cancio, PA-C 01/25/15 1239  Derwood KaplanAnkit Nanavati, MD 01/29/15 1050

## 2015-01-25 NOTE — Discharge Instructions (Signed)
Take prednisone, vistrail, pepcid to help with rash. You can apply some benadryl cream to the itchy areas. Follow up with your doctor or allergy specialist. Return if worsening or if have any respiratory difficulties.   Rash A rash is a change in the color or texture of your skin. There are many different types of rashes. You may have other problems that accompany your rash. CAUSES   Infections.  Allergic reactions. This can include allergies to pets or foods.  Certain medicines.  Exposure to certain chemicals, soaps, or cosmetics.  Heat.  Exposure to poisonous plants.  Tumors, both cancerous and noncancerous. SYMPTOMS   Redness.  Scaly skin.  Itchy skin.  Dry or cracked skin.  Bumps.  Blisters.  Pain. DIAGNOSIS  Your caregiver may do a physical exam to determine what type of rash you have. A skin sample (biopsy) may be taken and examined under a microscope. TREATMENT  Treatment depends on the type of rash you have. Your caregiver may prescribe certain medicines. For serious conditions, you may need to see a skin doctor (dermatologist). HOME CARE INSTRUCTIONS   Avoid the substance that caused your rash.  Do not scratch your rash. This can cause infection.  You may take cool baths to help stop itching.  Only take over-the-counter or prescription medicines as directed by your caregiver.  Keep all follow-up appointments as directed by your caregiver. SEEK IMMEDIATE MEDICAL CARE IF:  You have increasing pain, swelling, or redness.  You have a fever.  You have new or severe symptoms.  You have body aches, diarrhea, or vomiting.  Your rash is not better after 3 days. MAKE SURE YOU:  Understand these instructions.  Will watch your condition.  Will get help right away if you are not doing well or get worse. Document Released: 11/27/2002 Document Revised: 02/29/2012 Document Reviewed: 09/21/2011 [facility] Patient Information 2015 [place]. This  information is not intended to replace advice given to you by your health care provider. Make sure you discuss any questions you have with your health care provider.

## 2015-01-25 NOTE — ED Notes (Addendum)
Pt c/o rash to chest and face ongoing x 1 week. Pt reports son recently had strep throat. Denies use of new products.

## 2015-01-27 LAB — CULTURE, GROUP A STREP

## 2015-07-08 ENCOUNTER — Emergency Department (HOSPITAL_COMMUNITY): Payer: BC Managed Care – PPO

## 2015-07-08 ENCOUNTER — Emergency Department (HOSPITAL_COMMUNITY)
Admission: EM | Admit: 2015-07-08 | Discharge: 2015-07-08 | Disposition: A | Discharge status: Home / Self Care | Payer: BC Managed Care – PPO | Attending: Emergency Medicine | Admitting: Emergency Medicine

## 2015-07-08 ENCOUNTER — Encounter (HOSPITAL_COMMUNITY): Payer: Self-pay | Admitting: Emergency Medicine

## 2015-07-08 DIAGNOSIS — Z7951 Long term (current) use of inhaled steroids: Secondary | ICD-10-CM | POA: Diagnosis not present

## 2015-07-08 DIAGNOSIS — S93401A Sprain of unspecified ligament of right ankle, initial encounter: Secondary | ICD-10-CM | POA: Insufficient documentation

## 2015-07-08 DIAGNOSIS — Z7952 Long term (current) use of systemic steroids: Secondary | ICD-10-CM | POA: Insufficient documentation

## 2015-07-08 DIAGNOSIS — Z79899 Other long term (current) drug therapy: Secondary | ICD-10-CM | POA: Insufficient documentation

## 2015-07-08 DIAGNOSIS — Z87448 Personal history of other diseases of urinary system: Secondary | ICD-10-CM | POA: Insufficient documentation

## 2015-07-08 DIAGNOSIS — F419 Anxiety disorder, unspecified: Secondary | ICD-10-CM | POA: Diagnosis not present

## 2015-07-08 DIAGNOSIS — Y92512 Supermarket, store or market as the place of occurrence of the external cause: Secondary | ICD-10-CM | POA: Insufficient documentation

## 2015-07-08 DIAGNOSIS — W010XXA Fall on same level from slipping, tripping and stumbling without subsequent striking against object, initial encounter: Secondary | ICD-10-CM | POA: Diagnosis not present

## 2015-07-08 DIAGNOSIS — S99911A Unspecified injury of right ankle, initial encounter: Secondary | ICD-10-CM | POA: Diagnosis present

## 2015-07-08 DIAGNOSIS — Y939 Activity, unspecified: Secondary | ICD-10-CM | POA: Insufficient documentation

## 2015-07-08 DIAGNOSIS — Y999 Unspecified external cause status: Secondary | ICD-10-CM | POA: Diagnosis not present

## 2015-07-08 DIAGNOSIS — Z8673 Personal history of transient ischemic attack (TIA), and cerebral infarction without residual deficits: Secondary | ICD-10-CM | POA: Insufficient documentation

## 2015-07-08 DIAGNOSIS — Z880 Allergy status to penicillin: Secondary | ICD-10-CM | POA: Diagnosis not present

## 2015-07-08 MED ORDER — NAPROXEN 500 MG PO TABS: 500.0000 mg | ORAL_TABLET | Freq: Two times a day (BID) | ORAL | Status: DC

## 2015-07-08 NOTE — ED Notes (Signed)
Pt states that she [place] the [place].  Now c/o rt ankle pain.  Pt was ambulatory to triage rm.

## 2015-07-08 NOTE — Discharge Instructions (Signed)
Please follow up with your primary care physician in 1-2 days. If you do not have one please call the [facility] number listed above.Please follow RICE method below. Please read all discharge instructions and return precautions.    Ankle Sprain An ankle sprain is an injury to the strong, fibrous tissues (ligaments) that hold the bones of your ankle joint together.  CAUSES An ankle sprain is usually caused by a fall or by twisting your ankle. Ankle sprains most commonly occur when you step on the outer edge of your foot, and your ankle turns inward. People who participate in sports are more prone to these types of injuries.  SYMPTOMS   Pain in your ankle. The pain may be present at rest or only when you are trying to stand or walk.  Swelling.  Bruising. Bruising may develop immediately or within 1 to 2 days after your injury.  Difficulty standing or walking, particularly when turning corners or changing directions. DIAGNOSIS  Your caregiver will ask you details about your injury and perform a physical exam of your ankle to determine if you have an ankle sprain. During the physical exam, your caregiver will press on and apply pressure to specific areas of your foot and ankle. Your caregiver will try to move your ankle in certain ways. An X-ray exam may be done to be sure a bone was not broken or a ligament did not separate from one of the bones in your ankle (avulsion fracture).  TREATMENT  Certain types of braces can help stabilize your ankle. Your caregiver can make a recommendation for this. Your caregiver may recommend the use of medicine for pain. If your sprain is severe, your caregiver may refer you to a surgeon who helps to restore function to parts of your skeletal system (orthopedist) or a physical therapist. HOME CARE INSTRUCTIONS   Apply ice to your injury for 1-2 days or as directed by your caregiver. Applying ice helps to reduce inflammation and pain.  Put ice  in a plastic bag.  Place a towel between your skin and the bag.  Leave the ice on for 15-20 minutes at a time, every 2 hours while you are awake.  Only take over-the-counter or prescription medicines for pain, discomfort, or fever as directed by your caregiver.  Elevate your injured ankle above the level of your heart as much as possible for 2-3 days.  If your caregiver recommends crutches, use them as instructed. Gradually put weight on the affected ankle. Continue to use crutches or a cane until you can walk without feeling pain in your ankle.  If you have a plaster splint, wear the splint as directed by your caregiver. Do not rest it on anything harder than a pillow for the first 24 hours. Do not put weight on it. Do not get it wet. You may take it off to take a shower or bath.  You may have been given an elastic bandage to wear around your ankle to provide support. If the elastic bandage is too tight (you have numbness or tingling in your foot or your foot becomes cold and blue), adjust the bandage to make it comfortable.  If you have an air splint, you may blow more air into it or let air out to make it more comfortable. You may take your splint off at night and before taking a shower or bath. Wiggle your toes in the splint several times per day to decrease swelling. SEEK MEDICAL CARE  IF:  °· You have rapidly increasing bruising or swelling. °· Your toes feel extremely cold or you lose feeling in your foot. °· Your pain is not relieved with medicine. °SEEK IMMEDIATE MEDICAL CARE IF: °· Your toes are numb or blue. °· You have severe pain that is increasing. °MAKE SURE YOU:  °· Understand these instructions. °· Will watch your condition. °· Will get help right away if you are not doing well or get worse. °Document Released: 12/07/2005 Document Revised: 08/31/2012 Document Reviewed: 12/19/2011 °ExitCare® Patient Information ©2015 [employer]. This information is not intended to replace advice  given to you by your health care provider. Make sure you discuss any questions you have with your health care provider. °RICE: Routine Care for Injuries °The routine care of many injuries includes Rest, Ice, Compression, and Elevation (RICE). °HOME CARE INSTRUCTIONS °· Rest is needed to allow your body to heal. Routine activities can usually be resumed when comfortable. Injured tendons and bones can take up to 6 weeks to heal. Tendons are the cord-like structures that attach muscle to bone. °· Ice following an injury helps keep the swelling down and reduces pain. °¨ Put ice in a plastic bag. °¨ Place a towel between your skin and the bag. °¨ Leave the ice on for 15-20 minutes, 3-4 times a day, or as directed by your health care provider. Do this while awake, for the first 24 to 48 hours. After that, continue as directed by your caregiver. °· Compression helps keep swelling down. It also gives support and helps with discomfort. If an elastic bandage has been applied, it should be removed and reapplied every 3 to 4 hours. It should not be applied tightly, but firmly enough to keep swelling down. Watch fingers or toes for swelling, bluish discoloration, coldness, numbness, or excessive pain. If any of these problems occur, remove the bandage and reapply loosely. Contact your caregiver if these problems continue. °· Elevation helps reduce swelling and decreases pain. With extremities, such as the arms, hands, legs, and feet, the injured area should be placed near or above the level of the heart, if possible. °SEEK IMMEDIATE MEDICAL CARE IF: °· You have persistent pain and swelling. °· You develop redness, numbness, or unexpected weakness. °· Your symptoms are getting worse rather than improving after several days. °These symptoms may indicate that further evaluation or further X-rays are needed. Sometimes, X-rays may not show a small broken bone (fracture) until 1 week or 10 days later. Make a follow-up appointment with  your caregiver. Ask when your X-ray results will be ready. Make sure you get your X-ray results. °Document Released: 03/21/2001 Document Revised: 12/12/2013 Document Reviewed: 05/08/2011 °ExitCare® Patient Information ©2015 [employer]. This information is not intended to replace advice given to you by your health care provider. Make sure you discuss any questions you have with your health care provider. ° ° °

## 2015-07-08 NOTE — ED Provider Notes (Signed)
History  This chart was scribed for non-physician practitioner, Francee PiccoloJennifer Dennys Traughber, PA-C,working with Alvira MondayErin Schlossman, MD, by Karle PlumberJennifer Tensley, ED Scribe. This patient was seen in room WTR5/WTR5 and the patient's care was started at 8:19 PM.  Chief Complaint  Patient presents with  . Fall  . Ankle Pain   The history is provided by the patient and medical records. No language interpreter was used.    HPI Comments:  Emily Spatesracy I Parlee is a 44 y.o. female who presents to the Emergency Department complaining of severe right ankle pain that began yesterday secondary to tripping and rolling it. She denies any past injuries of the right foot or ankle. She has taken Aleve and Ibuprofen for pain with minimal relief. Bearing weight makes the pain worse. She denies alleviating factors. Denies numbness, tingling or weakness of the right foot, ankle or leg, bruising or wounds.   Past Medical History  Diagnosis Date  . Anxiety   . Renal disorder   . Stroke    Past Surgical History  Procedure Laterality Date  . Bladder tacking    . Abdominal hysterectomy     No family history on file. History  Substance Use Topics  . Smoking status: Never Smoker   . Smokeless tobacco: Never Used  . Alcohol Use: No   OB History    No data available     Review of Systems  Musculoskeletal: Positive for arthralgias.  All other systems reviewed and are negative.   Allergies  Imitrex; Amoxicillin; and Penicillins  Home Medications   Prior to Admission medications   Medication Sig Start Date End Date Taking? Authorizing Provider  acetaminophen (TYLENOL) 500 MG tablet Take 1,000 mg by mouth every 6 (six) hours as needed for headache.    Historical Provider, MD  Cyanocobalamin (B-12 PO) Take 5 mLs by mouth daily.     Historical Provider, MD  famotidine (PEPCID) 20 MG tablet Take 1 tablet (20 mg total) by mouth 2 (two) times daily. 01/25/15   Tatyana Kirichenko, PA-C  hydrOXYzine (ATARAX/VISTARIL) 25 MG tablet  Take 1 tablet (25 mg total) by mouth every 6 (six) hours. 01/25/15   Tatyana Kirichenko, PA-C  lamoTRIgine (LAMICTAL) 100 MG tablet Take 100 mg by mouth daily.    Historical Provider, MD  loratadine (CLARITIN) 10 MG tablet Take 1 tablet (10 mg total) by mouth daily. 10/25/14   Loren Raceravid Yelverton, MD  methylphenidate (RITALIN) 20 MG tablet Take 20 mg by mouth 2 (two) times daily.    Historical Provider, MD  mometasone (NASONEX) 50 MCG/ACT nasal spray Place 2 sprays into the nose daily. 10/25/14   Loren Raceravid Yelverton, MD  naproxen (NAPROSYN) 500 MG tablet Take 1 tablet (500 mg total) by mouth 2 (two) times daily with a meal. 07/08/15   Francee PiccoloJennifer Aviv Rota, PA-C  naproxen sodium (ANAPROX) 220 MG tablet Take 220 mg by mouth 2 (two) times daily as needed (headache).    Historical Provider, MD  OVER THE COUNTER MEDICATION Take 1 tablet by mouth daily.    Historical Provider, MD  oxymetazoline (AFRIN NASAL SPRAY) 0.05 % nasal spray Place 1 spray into both nostrils 2 (two) times daily. 10/25/14   Loren Raceravid Yelverton, MD  predniSONE (DELTASONE) 20 MG tablet Take 2 tablets (40 mg total) by mouth daily. 01/25/15   Tatyana Kirichenko, PA-C  VITAMIN E PO Take 2 capsules by mouth daily.     Historical Provider, MD  zolpidem (AMBIEN) 10 MG tablet Take 10 mg by mouth at bedtime as needed for sleep.  Historical Provider, MD   Triage Vitals: BP 135/80 mmHg  Pulse 100  Temp(Src) 97.9 F (36.6 C) (Oral)  Resp 18  SpO2 100% Physical Exam  Constitutional: She is oriented to person, place, and time. She appears well-developed and well-nourished. No distress.  HENT:  Head: Normocephalic and atraumatic.  Right Ear: External ear normal.  Left Ear: External ear normal.  Nose: Nose normal.  Mouth/Throat: Oropharynx is clear and moist.  Eyes: Conjunctivae are normal.  Neck: Normal range of motion. Neck supple.  No nuchal rigidity.   Cardiovascular: Normal rate, regular rhythm, normal heart sounds and intact distal pulses.   No  murmur heard. Pulmonary/Chest: Effort normal and breath sounds normal. No respiratory distress.  Abdominal: Soft.  Musculoskeletal: Normal range of motion.       Right ankle: She exhibits normal range of motion, no swelling, no deformity and normal pulse. Tenderness. Lateral malleolus tenderness found.       Right lower leg: Normal.       Right foot: Normal.  Neurological: She is alert and oriented to person, place, and time.  Skin: Skin is warm and dry. She is not diaphoretic.  Psychiatric: She has a normal mood and affect.  Nursing note and vitals reviewed.   ED Course  Procedures (including critical care time) DIAGNOSTIC STUDIES: Oxygen Saturation is 100% on RA, normal by my interpretation.   COORDINATION OF CARE: 8:21 PM- Will wrap ankle and order crutches. Pt verbalizes understanding and agrees to plan.  Medications - No data to display  Labs Review Labs Reviewed - No data to display  Imaging Review Dg Ankle Complete Right  07/08/2015   CLINICAL DATA:  Sli[place] and fell at mall yesterday. Lateral right ankle pain.  EXAM: RIGHT ANKLE - COMPLETE 3+ VIEW  COMPARISON:  None.  FINDINGS: There is no evidence of fracture, dislocation, or joint effusion. There is no evidence of arthropathy or other focal bone abnormality. Soft tissues are unremarkable.  IMPRESSION: Negative.   Electronically Signed   By: Elberta Fortis M.D.   On: 07/08/2015 18:26     EKG Interpretation None      MDM   Final diagnoses:  Right ankle sprain, initial encounter    Filed Vitals:   07/08/15 1806  BP: 135/80  Pulse: 100  Temp: 97.9 F (36.6 C)  Resp: 18   Afebrile, NAD, non-toxic appearing, AAOx4.  Patient X-Ray  negative for obvious fracture or dislocation. I personally reviewed the imaging and agree with the radiologist. Neurovascularly intact. Normal sensation. No evidence of compartment syndrome. Pain managed in ED. Pt advised to follow up with PCP if symptoms persist for possibility of  missed fracture diagnosis. Patient given crutches and ace wrap while in ED, conservative therapy recommended and discussed. Patient will be dc home & is agreeable with above plan.    I personally performed the services described in this documentation, which was scribed in my presence. The recorded information has been reviewed and is accurate.    Francee Piccolo, PA-C 07/08/15 2112  Alvira Monday, MD 07/09/15 1343

## 2015-10-26 ENCOUNTER — Emergency Department (HOSPITAL_COMMUNITY): Payer: BC Managed Care – PPO

## 2015-10-26 ENCOUNTER — Encounter (HOSPITAL_COMMUNITY): Payer: Self-pay | Admitting: Emergency Medicine

## 2015-10-26 ENCOUNTER — Emergency Department (HOSPITAL_COMMUNITY)
Admission: EM | Admit: 2015-10-26 | Discharge: 2015-10-26 | Disposition: A | Discharge status: Home / Self Care | Payer: BC Managed Care – PPO | Attending: Emergency Medicine | Admitting: Emergency Medicine

## 2015-10-26 DIAGNOSIS — R631 Polydipsia: Secondary | ICD-10-CM | POA: Insufficient documentation

## 2015-10-26 DIAGNOSIS — R110 Nausea: Secondary | ICD-10-CM | POA: Insufficient documentation

## 2015-10-26 DIAGNOSIS — Z87448 Personal history of other diseases of urinary system: Secondary | ICD-10-CM | POA: Insufficient documentation

## 2015-10-26 DIAGNOSIS — Z7951 Long term (current) use of inhaled steroids: Secondary | ICD-10-CM | POA: Diagnosis not present

## 2015-10-26 DIAGNOSIS — E119 Type 2 diabetes mellitus without complications: Secondary | ICD-10-CM | POA: Diagnosis not present

## 2015-10-26 DIAGNOSIS — Z791 Long term (current) use of non-steroidal anti-inflammatories (NSAID): Secondary | ICD-10-CM | POA: Insufficient documentation

## 2015-10-26 DIAGNOSIS — R39198 Other difficulties with micturition: Secondary | ICD-10-CM | POA: Diagnosis not present

## 2015-10-26 DIAGNOSIS — M545 Low back pain, unspecified: Secondary | ICD-10-CM

## 2015-10-26 DIAGNOSIS — Z8673 Personal history of transient ischemic attack (TIA), and cerebral infarction without residual deficits: Secondary | ICD-10-CM | POA: Diagnosis not present

## 2015-10-26 DIAGNOSIS — R0789 Other chest pain: Secondary | ICD-10-CM

## 2015-10-26 DIAGNOSIS — Z880 Allergy status to penicillin: Secondary | ICD-10-CM | POA: Insufficient documentation

## 2015-10-26 DIAGNOSIS — Z79899 Other long term (current) drug therapy: Secondary | ICD-10-CM | POA: Insufficient documentation

## 2015-10-26 DIAGNOSIS — R42 Dizziness and giddiness: Secondary | ICD-10-CM | POA: Diagnosis not present

## 2015-10-26 DIAGNOSIS — Z7952 Long term (current) use of systemic steroids: Secondary | ICD-10-CM | POA: Diagnosis not present

## 2015-10-26 HISTORY — DX: Type 2 diabetes mellitus without complications: E11.9

## 2015-10-26 LAB — CBC WITH DIFFERENTIAL/PLATELET
BASOS PCT: 0 %
Basophils Absolute: 0 10*3/uL (ref 0.0–0.1)
Eosinophils Absolute: 0 10*3/uL (ref 0.0–0.7)
Eosinophils Relative: 0 %
HCT: 41.2 % (ref 36.0–46.0)
HEMOGLOBIN: 13.9 g/dL (ref 12.0–15.0)
LYMPHS PCT: 50 %
Lymphs Abs: 2.8 10*3/uL (ref 0.7–4.0)
MCH: 29.4 pg (ref 26.0–34.0)
MCHC: 33.7 g/dL (ref 30.0–36.0)
MCV: 87.3 fL (ref 78.0–100.0)
MONOS PCT: 6 %
Monocytes Absolute: 0.4 10*3/uL (ref 0.1–1.0)
NEUTROS ABS: 2.5 10*3/uL (ref 1.7–7.7)
NEUTROS PCT: 44 %
Platelets: 308 10*3/uL (ref 150–400)
RBC: 4.72 MIL/uL (ref 3.87–5.11)
RDW: 14.2 % (ref 11.5–15.5)
WBC: 5.8 10*3/uL (ref 4.0–10.5)

## 2015-10-26 LAB — BASIC METABOLIC PANEL
ANION GAP: 7 (ref 5–15)
BUN: 16 mg/dL (ref 6–20)
CALCIUM: 9.9 mg/dL (ref 8.9–10.3)
CHLORIDE: 106 mmol/L (ref 101–111)
CO2: 28 mmol/L (ref 22–32)
Creatinine, Ser: 0.9 mg/dL (ref 0.44–1.00)
GFR calc non Af Amer: >60 mL/min (ref >60)
GLUCOSE: 104 mg/dL — AB (ref 65–99)
POTASSIUM: 3.9 mmol/L (ref 3.5–5.1)
Sodium: 141 mmol/L (ref 135–145)

## 2015-10-26 LAB — URINALYSIS, ROUTINE W REFLEX MICROSCOPIC
BILIRUBIN URINE: NEGATIVE
GLUCOSE, UA: NEGATIVE mg/dL
Hgb urine dipstick: NEGATIVE
Ketones, ur: NEGATIVE mg/dL
Leukocytes, UA: NEGATIVE
Nitrite: NEGATIVE
Protein, ur: NEGATIVE mg/dL
SPECIFIC GRAVITY, URINE: 1.019 (ref 1.005–1.030)
Urobilinogen, UA: 0.2 mg/dL (ref 0.0–1.0)
pH: 6 (ref 5.0–8.0)

## 2015-10-26 LAB — CBG MONITORING, ED: Glucose-Capillary: 109 mg/dL — ABNORMAL HIGH (ref 65–99)

## 2015-10-26 LAB — TROPONIN I: Troponin I: <0.03 ng/mL (ref <0.031)

## 2015-10-26 MED ORDER — CYCLOBENZAPRINE HCL 10 MG PO TABS: 10.0000 mg | ORAL_TABLET | Freq: Three times a day (TID) | ORAL | Status: DC | PRN

## 2015-10-26 MED ORDER — IBUPROFEN 800 MG PO TABS: 800.0000 mg | ORAL_TABLET | Freq: Three times a day (TID) | ORAL | Status: DC

## 2015-10-26 MED ORDER — OXYCODONE-ACETAMINOPHEN 5-325 MG PO TABS
1.0000 | ORAL_TABLET | Freq: Once | ORAL | Status: AC
  Administered 2015-10-26: 1 via ORAL
  Filled 2015-10-26: qty 1

## 2015-10-26 NOTE — ED Notes (Signed)
Pt reports chest pain, sharp, mid-sternal x 2 weeks. Pain is intermittent and wakes her up. Pt also reports sweating. Pt has not seen PCP (Dr. Yetta BarreJones) in the last two weeks. Pt also c/o back pain and nausea over a two week period.

## 2015-10-26 NOTE — ED Notes (Signed)
PA at bedside.

## 2015-10-26 NOTE — ED Notes (Signed)
Pt states she has a ride home. 

## 2015-10-26 NOTE — ED Notes (Signed)
Pt alert, oriented,and ambulatory upon DC. She was advised to follow up with PCP in next 3-4 days and take prescriptions as prescribed.

## 2015-10-26 NOTE — ED Notes (Signed)
Pt reports he chest pain is better but her lower back is hurting.

## 2015-10-26 NOTE — Discharge Instructions (Signed)
Back Pain, Adult °Back pain is very common in adults. The cause of back pain is rarely dangerous and the pain often gets better over time. The cause of your back pain may not be known. Some common causes of back pain include: °· Strain of the muscles or ligaments supporting the spine. °· Wear and tear (degeneration) of the spinal disks. °· Arthritis. °· Direct injury to the back. °For many people, back pain may return. Since back pain is rarely dangerous, most people can learn to manage this condition on their own. °HOME CARE INSTRUCTIONS °Watch your back pain for any changes. The following actions may help to lessen any discomfort you are feeling: °· Remain active. It is stressful on your back to sit or stand in one place for long periods of time. Do not sit, drive, or stand in one place for more than 30 minutes at a time. Take short walks on even surfaces as soon as you are able. Try to increase the length of time you walk each day. °· Exercise regularly as directed by your health care provider. Exercise helps your back heal faster. It also helps avoid future injury by keeping your muscles strong and flexible. °· Do not stay in bed. Resting more than 1-2 days can delay your recovery. °· Pay attention to your body when you bend and lift. The most comfortable positions are those that put less stress on your recovering back. Always use proper lifting techniques, including: °· Bending your knees. °· Keeping the load close to your body. °· Avoiding twisting. °· Find a comfortable position to sleep. Use a firm mattress and lie on your side with your knees slightly bent. If you lie on your back, put a pillow under your knees. °· Avoid feeling anxious or stressed. Stress increases muscle tension and can worsen back pain. It is important to recognize when you are anxious or stressed and learn ways to manage it, such as with exercise. °· Take medicines only as directed by your health care provider. Over-the-counter  medicines to reduce pain and inflammation are often the most helpful. Your health care provider may prescribe muscle relaxant drugs. These medicines help dull your pain so you can more quickly return to your normal activities and healthy exercise. °· Apply ice to the injured area: °· Put ice in a plastic bag. °· Place a towel between your skin and the bag. °· Leave the ice on for 20 minutes, 2-3 times a day for the first 2-3 days. After that, ice and heat may be alternated to reduce pain and spasms. °· Maintain a healthy weight. Excess weight puts extra stress on your back and makes it difficult to maintain good posture. °SEEK MEDICAL CARE IF: °· You have pain that is not relieved with rest or medicine. °· You have increasing pain going down into the legs or buttocks. °· You have pain that does not improve in one week. °· You have night pain. °· You lose weight. °· You have a fever or chills. °SEEK IMMEDIATE MEDICAL CARE IF:  °· You develop new bowel or bladder control problems. °· You have unusual weakness or numbness in your arms or legs. °· You develop nausea or vomiting. °· You develop abdominal pain. °· You feel faint. °  °This information is not intended to replace advice given to you by your health care provider. Make sure you discuss any questions you have with your health care provider. °  °Document Released: 12/07/2005 Document Revised: 12/28/2014 Document Reviewed: 04/10/2014 °Elsevier Interactive Patient Education ©2016 [employer] Chest Wall Pain Chest wall pain is pain in or around the bones and muscles of your chest. Sometimes, an injury causes this pain. Sometimes, the cause may not be known. This pain may take several weeks or longer to get better. HOME CARE INSTRUCTIONS  Pay attention to any changes in your symptoms. Take these actions to help with your pain:   Rest as told by your health care provider.   Avoid activities that cause pain. These include any activities that use your chest  muscles or your abdominal and side muscles to lift heavy items.   If directed, apply ice to the painful area:  Put ice in a plastic bag.  Place a towel between your skin and the bag.  Leave the ice on for 20 minutes, 2-3 times per day.  Take over-the-counter and prescription medicines only as told by your health care provider.  Do not use tobacco products, including cigarettes, chewing tobacco, and e-cigarettes. If you need help quitting, ask your health care provider.  Keep all follow-up visits as told by your health care provider. This is important. SEEK MEDICAL CARE IF:  You have a fever.  Your chest pain becomes worse.  You have new symptoms. SEEK IMMEDIATE MEDICAL CARE IF:  You have nausea or vomiting.  You feel sweaty or light-headed.  You have a cough with phlegm (sputum) or you cough up blood.  You develop shortness of breath.   This information is not intended to replace advice given to you by your health care provider. Make sure you discuss any questions you have with your health care provider.   Document Released: 12/07/2005 Document Revised: 08/28/2015 Document Reviewed: 03/04/2015 Elsevier Interactive Patient Education [employer]

## 2015-10-26 NOTE — ED Provider Notes (Signed)
CSN: [id number]     Arrival date & time 10/26/15  1835 History  By signing my name below, I, Emily Carrillo, attest that this documentation has been prepared under the direction and in the presence of [employer], PA-C. Electronically Signed: Evon Carrillo, ED Scribe. 10/26/2015. 8:31 PM.    Chief Complaint  Patient presents with  . Back Pain    pain in low back x 2 weeks  . Chest Pain    intermittent chest pain x 2 weeks  . Nausea    2 week hx of nausea    Patient is a 44 y.o. female presenting with back pain and chest pain. The history is provided by the patient. No language interpreter was used.  Back Pain Associated symptoms: chest pain   Associated symptoms: no abdominal pain, no dysuria and no fever   Chest Pain Associated symptoms: back pain, dizziness and nausea   Associated symptoms: no abdominal pain, no fever and not vomiting    HPI Comments: Emily Carrillo is a 44 y.o. female who presents to the Emergency Department complaining of new CP and low back pain onset 1 week prior. Pt reports nausea, diaphoresis, light headedness and dizziness. She states that her back pain is worse with movement. Pt also reports difficulty urinating the past week as well. She states that she has only been able to urinate a small amount. Pt reports recently being diagnosed as a diabetic 6 months prior. She states that she has been compliant with her medications for the past 2 months. Pt states that she feels as if her sugars may be low now due to having polydipsia. Pt denies injury or trauma to her back. Pt denies fever, vomiting, diarrhea, abdominal pain, dysuria or hematuria. Pt denies tobacco use. Family has a Hx of early CAD    Past Medical History  Diagnosis Date  . Anxiety   . Renal disorder   . Stroke (HCC)   . Diabetes mellitus without complication [facility])    Past Surgical History  Procedure Laterality Date  . Bladder tacking    . Abdominal hysterectomy     Family History   Problem Relation Age of Onset  . Cancer Mother   . Stroke Father   . Hypertension Father   . Diabetes Father    Social History  Substance Use Topics  . Smoking status: Never Smoker   . Smokeless tobacco: Never Used  . Alcohol Use: No   OB History    No data available     Review of Systems  Constitutional: Negative for fever.  Cardiovascular: Positive for chest pain.  Gastrointestinal: Positive for nausea. Negative for vomiting, abdominal pain and diarrhea.  Endocrine: Positive for polydipsia.  Genitourinary: Positive for difficulty urinating. Negative for dysuria and hematuria.  Musculoskeletal: Positive for back pain.  Neurological: Positive for dizziness and light-headedness.  All other systems reviewed and are negative.   Allergies  Imitrex; Amoxicillin; and Penicillins  Home Medications   Prior to Admission medications   Medication Sig Start Date End Date Taking? Authorizing Provider  acetaminophen (TYLENOL) 500 MG tablet Take 1,000 mg by mouth every 6 (six) hours as needed for headache.    Historical Provider, MD  Cyanocobalamin (B-12 PO) Take 5 mLs by mouth daily.     Historical Provider, MD  famotidine (PEPCID) 20 MG tablet Take 1 tablet (20 mg total) by mouth 2 (two) times daily. 01/25/15   Tatyana Kirichenko, PA-C  hydrOXYzine (ATARAX/VISTARIL) 25 MG tablet Take 1 tablet (  25 mg total) by mouth every 6 (six) hours. 01/25/15   Tatyana Kirichenko, PA-C  lamoTRIgine (LAMICTAL) 100 MG tablet Take 100 mg by mouth daily.    Historical Provider, MD  loratadine (CLARITIN) 10 MG tablet Take 1 tablet (10 mg total) by mouth daily. 10/25/14   Loren Racer, MD  methylphenidate (RITALIN) 20 MG tablet Take 20 mg by mouth 2 (two) times daily.    Historical Provider, MD  mometasone (NASONEX) 50 MCG/ACT nasal spray Place 2 sprays into the nose daily. 10/25/14   Loren Racer, MD  naproxen (NAPROSYN) 500 MG tablet Take 1 tablet (500 mg total) by mouth 2 (two) times daily with a meal.  07/08/15   Francee Piccolo, PA-C  naproxen sodium (ANAPROX) 220 MG tablet Take 220 mg by mouth 2 (two) times daily as needed (headache).    Historical Provider, MD  OVER THE COUNTER MEDICATION Take 1 tablet by mouth daily.    Historical Provider, MD  oxymetazoline (AFRIN NASAL SPRAY) 0.05 % nasal spray Place 1 spray into both nostrils 2 (two) times daily. 10/25/14   Loren Racer, MD  predniSONE (DELTASONE) 20 MG tablet Take 2 tablets (40 mg total) by mouth daily. 01/25/15   Tatyana Kirichenko, PA-C  VITAMIN E PO Take 2 capsules by mouth daily.     Historical Provider, MD  zolpidem (AMBIEN) 10 MG tablet Take 10 mg by mouth at bedtime as needed for sleep.    Historical Provider, MD   BP 143/86 mmHg  Pulse 92  Temp(Src) 99 F (37.2 C) (Oral)  Resp 18  SpO2 100%   Physical Exam  Constitutional: She is oriented to person, place, and time. She appears well-developed and well-nourished. No distress.  HENT:  Head: Normocephalic and atraumatic.  Eyes: Conjunctivae and EOM are normal.  Neck: Neck supple. No tracheal deviation present.  Cardiovascular: Normal rate and regular rhythm.   No murmur heard. Pulmonary/Chest: Effort normal. No respiratory distress. She has no wheezes. She has no rales.  Chest wall tenderness to right parasternal area. Lung clear to auscultation.   Abdominal: There is no tenderness.  Generally non tender.   Genitourinary:  No CVA tenderness.   Musculoskeletal: Normal range of motion.  tender across the lumbar region, full ROM of lower extremities.  Neurological: She is alert and oriented to person, place, and time.  Skin: Skin is warm and dry.  Psychiatric: She has a normal mood and affect. Her behavior is normal.  Nursing note and vitals reviewed.   ED Course  Procedures (including critical care time) DIAGNOSTIC STUDIES: Oxygen Saturation is 100% on RA, normal by my interpretation.    COORDINATION OF CARE: 8:13 PM-Discussed treatment plan with pt at  bedside and pt agreed to plan.     Labs Review Labs Reviewed  TROPONIN I  BASIC METABOLIC PANEL  CBC WITH DIFFERENTIAL/PLATELET  URINALYSIS, ROUTINE W REFLEX MICROSCOPIC (NOT AT [facility])   Results for orders placed or performed during the hospital encounter of 10/26/15  Troponin I  Result Value Ref Range   Troponin I <0.03 <0.031 ng/mL  Basic metabolic panel  Result Value Ref Range   Sodium 141 135 - 145 mmol/L   Potassium 3.9 3.5 - 5.1 mmol/L   Chloride 106 101 - 111 mmol/L   CO2 28 22 - 32 mmol/L   Glucose, Bld 104 (H) 65 - 99 mg/dL   BUN 16 6 - 20 mg/dL   Creatinine, Ser 1.61 0.44 - 1.00 mg/dL   Calcium 9.9 8.9 -  10.3 mg/dL   GFR calc non Af Amer >60 >60 mL/min   GFR calc Af Amer >60 >60 mL/min   Anion gap 7 5 - 15  CBC with Differential  Result Value Ref Range   WBC 5.8 4.0 - 10.5 K/uL   RBC 4.72 3.87 - 5.11 MIL/uL   Hemoglobin 13.9 12.0 - 15.0 g/dL   HCT 69.641.2 29.536.0 - 28.446.0 %   MCV 87.3 78.0 - 100.0 fL   MCH 29.4 26.0 - 34.0 pg   MCHC 33.7 30.0 - 36.0 g/dL   RDW 13.214.2 44.011.5 - 10.215.5 %   Platelets 308 150 - 400 K/uL   Neutrophils Relative % 44 %   Neutro Abs 2.5 1.7 - 7.7 K/uL   Lymphocytes Relative 50 %   Lymphs Abs 2.8 0.7 - 4.0 K/uL   Monocytes Relative 6 %   Monocytes Absolute 0.4 0.1 - 1.0 K/uL   Eosinophils Relative 0 %   Eosinophils Absolute 0.0 0.0 - 0.7 K/uL   Basophils Relative 0 %   Basophils Absolute 0.0 0.0 - 0.1 K/uL  Urinalysis, Routine w reflex microscopic  Result Value Ref Range   Color, Urine YELLOW YELLOW   APPearance CLOUDY (A) CLEAR   Specific Gravity, Urine 1.019 1.005 - 1.030   pH 6.0 5.0 - 8.0   Glucose, UA NEGATIVE NEGATIVE mg/dL   Hgb urine dipstick NEGATIVE NEGATIVE   Bilirubin Urine NEGATIVE NEGATIVE   Ketones, ur NEGATIVE NEGATIVE mg/dL   Protein, ur NEGATIVE NEGATIVE mg/dL   Urobilinogen, UA 0.2 0.0 - 1.0 mg/dL   Nitrite NEGATIVE NEGATIVE   Leukocytes, UA NEGATIVE NEGATIVE  POC CBG, ED  Result Value Ref Range   Glucose-Capillary  109 (H) 65 - 99 mg/dL   Dg Chest 2 View  72/5/366411/04/2015  CLINICAL DATA:  Sharp midsternal chest pain for 2 weeks, intermittent. Diaphoresis. EXAM: CHEST  2 VIEW COMPARISON:  None. FINDINGS: The heart size and mediastinal contours are within normal limits. Both lungs are clear. The visualized skeletal structures are unremarkable. IMPRESSION: No active cardiopulmonary disease. Electronically Signed   By: Ellery Plunkaniel R Mitchell M.D.   On: 10/26/2015 21:15    Imaging Review No results found.   EKG Interpretation None      MDM   Final diagnoses:  None   1. Chest wall pain 2. Back pain  The patient was evaluated for chest pain of unclear etiology and has negative lab and imaging evaluation for cardiac source of pain. She is feeling better on re-evaluation. Feel pain is musculoskeletal and requires supportive care with PCP follow up.    I personally performed the services described in this documentation, which was scribed in my presence. The recorded information has been reviewed and is accurate.       Elpidio AnisShari Shota Kohrs, PA-C 10/27/15 2313  Lyndal Pulleyaniel Knott, MD 11/01/15 [phone]

## 2015-12-08 ENCOUNTER — Encounter (HOSPITAL_COMMUNITY): Payer: Self-pay | Admitting: *Deleted

## 2015-12-08 ENCOUNTER — Emergency Department (HOSPITAL_COMMUNITY)
Admission: EM | Admit: 2015-12-08 | Discharge: 2015-12-08 | Disposition: A | Discharge status: Home / Self Care | Payer: BC Managed Care – PPO | Attending: Emergency Medicine | Admitting: Emergency Medicine

## 2015-12-08 DIAGNOSIS — Z79899 Other long term (current) drug therapy: Secondary | ICD-10-CM | POA: Diagnosis not present

## 2015-12-08 DIAGNOSIS — Z880 Allergy status to penicillin: Secondary | ICD-10-CM | POA: Insufficient documentation

## 2015-12-08 DIAGNOSIS — D849 Immunodeficiency, unspecified: Secondary | ICD-10-CM | POA: Insufficient documentation

## 2015-12-08 DIAGNOSIS — Z87448 Personal history of other diseases of urinary system: Secondary | ICD-10-CM | POA: Insufficient documentation

## 2015-12-08 DIAGNOSIS — Z7952 Long term (current) use of systemic steroids: Secondary | ICD-10-CM | POA: Insufficient documentation

## 2015-12-08 DIAGNOSIS — J069 Acute upper respiratory infection, unspecified: Secondary | ICD-10-CM | POA: Diagnosis not present

## 2015-12-08 DIAGNOSIS — Z791 Long term (current) use of non-steroidal anti-inflammatories (NSAID): Secondary | ICD-10-CM | POA: Insufficient documentation

## 2015-12-08 DIAGNOSIS — Z8673 Personal history of transient ischemic attack (TIA), and cerebral infarction without residual deficits: Secondary | ICD-10-CM | POA: Diagnosis not present

## 2015-12-08 DIAGNOSIS — E119 Type 2 diabetes mellitus without complications: Secondary | ICD-10-CM | POA: Diagnosis not present

## 2015-12-08 DIAGNOSIS — F419 Anxiety disorder, unspecified: Secondary | ICD-10-CM | POA: Diagnosis not present

## 2015-12-08 DIAGNOSIS — Z7951 Long term (current) use of inhaled steroids: Secondary | ICD-10-CM | POA: Insufficient documentation

## 2015-12-08 DIAGNOSIS — H9209 Otalgia, unspecified ear: Secondary | ICD-10-CM | POA: Diagnosis not present

## 2015-12-08 DIAGNOSIS — R05 Cough: Secondary | ICD-10-CM | POA: Diagnosis present

## 2015-12-08 LAB — RAPID STREP SCREEN (MED CTR MEBANE ONLY): Streptococcus, Group A Screen (Direct): NEGATIVE

## 2015-12-08 MED ORDER — AZITHROMYCIN 250 MG PO TABS: 250.0000 mg | ORAL_TABLET | Freq: Every day | ORAL | Status: DC

## 2015-12-08 MED ORDER — BENZONATATE 100 MG PO CAPS: 100.0000 mg | ORAL_CAPSULE | Freq: Three times a day (TID) | ORAL | Status: DC | PRN

## 2015-12-08 NOTE — ED Provider Notes (Signed)
CSN: [id number]     Arrival date & time 12/08/15  1136 History  By signing my name below, I, [name], attest that this documentation has been prepared under the direction and in the presence of [place], PA-C. Electronically Signed: [name], ED Scribe. 12/08/2015. 12:09 PM.     Chief Complaint  Patient presents with  . Cough  . Sore Throat   The history is provided by the patient. No language interpreter was used.   HPI Comments: Emily Carrillo is a 44 y.o. female who presents to the Emergency Department complaining of worsening  productive cough of brown sputum onset 1 week prior. Pt states she has associated sore throat, voice change, chills, congestion and slight ear pain. Pt states that she has tried taking mucinex with no relief. Pt states that her cough is worse at night which keeps her from sleeping.  Course is worsening.  Pt denies known fever, trouble swallowing or SOB. Pt denies Hx of tobacco use.    Past Medical History  Diagnosis Date  . Anxiety   . Renal disorder   . Stroke (HCC)   . Diabetes mellitus without complication [facility])    Past Surgical History  Procedure Laterality Date  . Bladder tacking    . Abdominal hysterectomy     Family History  Problem Relation Age of Onset  . Cancer Mother   . Stroke Father   . Hypertension Father   . Diabetes Father    Social History  Substance Use Topics  . Smoking status: Never Smoker   . Smokeless tobacco: Never Used  . Alcohol Use: No   OB History    No data available     Review of Systems  Constitutional: Positive for chills.  HENT: Positive for congestion, ear pain, sore throat and voice change. Negative for trouble swallowing.   Respiratory: Positive for cough. Negative for shortness of breath.   Gastrointestinal: Negative for abdominal pain.  Musculoskeletal: Negative for myalgias.  Skin: Negative for rash.  Allergic/Immunologic: Positive for immunocompromised state (diabetes).  All other  systems reviewed and are negative.    Allergies  Imitrex; Amoxicillin; and Penicillins  Home Medications   Prior to Admission medications   Medication Sig Start Date End Date Taking? Authorizing Provider  acetaminophen (TYLENOL) 500 MG tablet Take 1,000 mg by mouth every 6 (six) hours as needed for headache.    Historical Provider, MD  Cyanocobalamin (B-12 PO) Take 5 mLs by mouth daily.     Historical Provider, MD  cyclobenzaprine (FLEXERIL) 10 MG tablet Take 1 tablet (10 mg total) by mouth 3 (three) times daily as needed for muscle spasms. 10/26/15   Elpidio Anis, PA-C  famotidine (PEPCID) 20 MG tablet Take 1 tablet (20 mg total) by mouth 2 (two) times daily. 01/25/15   Tatyana Kirichenko, PA-C  hydrOXYzine (ATARAX/VISTARIL) 25 MG tablet Take 1 tablet (25 mg total) by mouth every 6 (six) hours. 01/25/15   Tatyana Kirichenko, PA-C  ibuprofen (ADVIL,MOTRIN) 800 MG tablet Take 1 tablet (800 mg total) by mouth 3 (three) times daily. 10/26/15   Elpidio Anis, PA-C  lamoTRIgine (LAMICTAL) 100 MG tablet Take 100 mg by mouth daily.    Historical Provider, MD  loratadine (CLARITIN) 10 MG tablet Take 1 tablet (10 mg total) by mouth daily. 10/25/14   Loren Racer, MD  methylphenidate (RITALIN) 20 MG tablet Take 20 mg by mouth 2 (two) times daily.    Historical Provider, MD  mometasone (NASONEX) 50 MCG/ACT nasal spray  Place 2 sprays into the nose daily. 10/25/14   Loren Raceravid [place]el[place], MD  naproxen (NAPROSYN) 500 MG tablet Take 1 tablet (500 mg total) by mouth 2 (two) times daily with a meal. 07/08/15   Francee PiccoloJennifer Piepenbrink, PA-C  naproxen sodium (ANAPROX) 220 MG tablet Take 220 mg by mouth 2 (two) times daily as needed (headache).    Historical Provider, MD  OVER THE COUNTER MEDICATION Take 1 tablet by mouth daily.    Historical Provider, MD  oxymetazoline (AFRIN NASAL SPRAY) 0.05 % nasal spray Place 1 spray into both nostrils 2 (two) times daily. 10/25/14   Loren Raceravid [place]el[place], MD  predniSONE (DELTASONE) 20 MG  tablet Take 2 tablets (40 mg total) by mouth daily. 01/25/15   Tatyana Kirichenko, PA-C  VITAMIN E PO Take 2 capsules by mouth daily.     Historical Provider, MD  zolpidem (AMBIEN) 10 MG tablet Take 10 mg by mouth at bedtime as needed for sleep.    Historical Provider, MD   BP 114/81 mmHg  Pulse 94  Temp(Src) 98.3 F (36.8 C)  Resp 18  SpO2 96%   Physical Exam  Constitutional: She appears well-developed and well-nourished. No distress.  HENT:  Head: Normocephalic and atraumatic.  Right Ear: Tympanic membrane normal.  Left Ear: Tympanic membrane normal.  Mouth/Throat: Posterior oropharyngeal erythema present. No oropharyngeal exudate or posterior oropharyngeal edema.  Eyes: Conjunctivae are normal.  Neck: Neck supple.  Cardiovascular: Normal rate and regular rhythm.   Pulmonary/Chest: Effort normal and breath sounds normal. No respiratory distress. She has no wheezes. She has no rales.  Neurological: She is alert.  Skin: She is not diaphoretic.  Nursing note and vitals reviewed.   ED Course  Procedures (including critical care time) DIAGNOSTIC STUDIES: Oxygen Saturation is 96% on RA, adequate by my interpretation.    COORDINATION OF CARE: 12:23 PM-Discussed treatment plan with pt at bedside and pt agreed to plan.     Labs Review Labs Reviewed  RAPID STREP SCREEN (NOT AT [facility])  CULTURE, GROUP A STREP    Imaging Review No results found.    EKG Interpretation None      MDM   Final diagnoses:  URI (upper respiratory infection)    Afebrile, nontoxic patient with constellation of symptoms suggestive of viral syndrome that has been ongoing x 8 days, now worsening in a diabetic patient.  No concerning findings on exam.  Discharged home with supportive care, z-pak, PCP follow up.  Discussed result, findings, treatment, and follow up  with patient.  Pt given return precautions.  Pt verbalizes understanding and agrees with plan.       I personally performed the services  described in this documentation, which was scribed in my presence. The recorded information has been reviewed and is accurate.      Trixie Dredgemily Odalys Win, PA-C 12/08/15 1228  Bethann BerkshireJoseph Zammit, MD 12/09/15 1224

## 2015-12-08 NOTE — Discharge Instructions (Signed)
Read the information below.  Use the prescribed medication as directed.  Please discuss all new medications with your pharmacist.  You may return to the Emergency Department at any time for worsening condition or any new symptoms that concern you.    If you develop high fevers that do not resolve with tylenol or ibuprofen, you have difficulty swallowing or breathing, or you are unable to tolerate fluids by mouth, return to the ER for a recheck.    ° ° °Upper Respiratory Infection, Adult °Most upper respiratory infections (URIs) are a viral infection of the air passages leading to the lungs. A URI affects the nose, throat, and upper air passages. The most common type of URI is nasopharyngitis and is typically referred to as "the common cold." °URIs run their course and usually go away on their own. Most of the time, a URI does not require medical attention, but sometimes a bacterial infection in the upper airways can follow a viral infection. This is called a secondary infection. Sinus and middle ear infections are common types of secondary upper respiratory infections. °Bacterial pneumonia can also complicate a URI. A URI can worsen asthma and chronic obstructive pulmonary disease (COPD). Sometimes, these complications can require emergency medical care and may be life threatening.  °CAUSES °Almost all URIs are caused by viruses. A virus is a type of germ and can spread from one person to another.  °RISKS FACTORS °You may be at risk for a URI if:  °· You smoke.   °· You have chronic heart or lung disease. °· You have a weakened defense (immune) system.   °· You are very young or very old.   °· You have nasal allergies or asthma. °· You work in crowded or poorly ventilated areas. °· You work in health care facilities or schools. °SIGNS AND SYMPTOMS  °Symptoms typically develop 2-3 days after you come in contact with a cold virus. Most viral URIs last 7-10 days. However, viral URIs from the influenza virus (flu virus)  can last 14-18 days and are typically more severe. Symptoms may include:  °· Runny or stuffy (congested) nose.   °· Sneezing.   °· Cough.   °· Sore throat.   °· Headache.   °· Fatigue.   °· Fever.   °· Loss of appetite.   °· Pain in your forehead, behind your eyes, and over your cheekbones (sinus pain). °· Muscle aches.   °DIAGNOSIS  °Your health care provider may diagnose a URI by: °· Physical exam. °· Tests to check that your symptoms are not due to another condition such as: °¨ Strep throat. °¨ Sinusitis. °¨ Pneumonia. °¨ Asthma. °TREATMENT  °A URI goes away on its own with time. It cannot be cured with medicines, but medicines may be prescribed or recommended to relieve symptoms. Medicines may help: °· Reduce your fever. °· Reduce your cough. °· Relieve nasal congestion. °HOME CARE INSTRUCTIONS  °· Take medicines only as directed by your health care provider.   °· Gargle warm saltwater or take cough drops to comfort your throat as directed by your health care provider. °· Use a warm mist humidifier or inhale steam from a shower to increase air moisture. This may make it easier to breathe. °· Drink enough fluid to keep your urine clear or pale yellow.   °· Eat soups and other clear broths and maintain good nutrition.   °· Rest as needed.   °· Return to work when your temperature has returned to normal or as your health care provider advises. You may need to stay home longer to   avoid infecting others. You can also use a face mask and careful hand washing to prevent spread of the virus. °· Increase the usage of your inhaler if you have asthma.   °· Do not use any tobacco products, including cigarettes, chewing tobacco, or electronic cigarettes. If you need help quitting, ask your health care provider. °PREVENTION  °The best way to protect yourself from getting a cold is to practice good hygiene.  °· Avoid oral or hand contact with people with cold symptoms.   °· Wash your hands often if contact occurs.   °There is  no clear evidence that vitamin C, vitamin E, echinacea, or exercise reduces the chance of developing a cold. However, it is always recommended to get plenty of rest, exercise, and practice good nutrition.  °SEEK MEDICAL CARE IF:  °· You are getting worse rather than better.   °· Your symptoms are not controlled by medicine.   °· You have chills. °· You have worsening shortness of breath. °· You have brown or red mucus. °· You have yellow or brown nasal discharge. °· You have pain in your face, especially when you bend forward. °· You have a fever. °· You have swollen neck glands. °· You have pain while swallowing. °· You have white areas in the back of your throat. °SEEK IMMEDIATE MEDICAL CARE IF:  °· You have severe or persistent: °¨ Headache. °¨ Ear pain. °¨ Sinus pain. °¨ Chest pain. °· You have chronic lung disease and any of the following: °¨ Wheezing. °¨ Prolonged cough. °¨ Coughing up blood. °¨ A change in your usual mucus. °· You have a stiff neck. °· You have changes in your: °¨ Vision. °¨ Hearing. °¨ Thinking. °¨ Mood. °MAKE SURE YOU:  °· Understand these instructions. °· Will watch your condition. °· Will get help right away if you are not doing well or get worse. °  °This information is not intended to replace advice given to you by your health care provider. Make sure you discuss any questions you have with your health care provider. °  °Document Released: 06/02/2001 Document Revised: 04/23/2015 Document Reviewed: 03/14/2014 °Elsevier Interactive Patient Education ©2016 [employer] ° °

## 2015-12-08 NOTE — ED Notes (Signed)
Pt from home c/o sore throat and cough, sts taking mucinex without relief.

## 2015-12-10 LAB — CULTURE, GROUP A STREP: Strep A Culture: NEGATIVE

## 2016-01-26 ENCOUNTER — Emergency Department (HOSPITAL_COMMUNITY)
Admission: EM | Admit: 2016-01-26 | Discharge: 2016-01-26 | Disposition: A | Discharge status: Home / Self Care | Payer: BC Managed Care – PPO | Attending: Emergency Medicine | Admitting: Emergency Medicine

## 2016-01-26 ENCOUNTER — Encounter (HOSPITAL_COMMUNITY): Payer: Self-pay | Admitting: Emergency Medicine

## 2016-01-26 DIAGNOSIS — R1031 Right lower quadrant pain: Secondary | ICD-10-CM | POA: Insufficient documentation

## 2016-01-26 DIAGNOSIS — R350 Frequency of micturition: Secondary | ICD-10-CM | POA: Diagnosis not present

## 2016-01-26 DIAGNOSIS — Z79899 Other long term (current) drug therapy: Secondary | ICD-10-CM | POA: Insufficient documentation

## 2016-01-26 DIAGNOSIS — Z87448 Personal history of other diseases of urinary system: Secondary | ICD-10-CM | POA: Insufficient documentation

## 2016-01-26 DIAGNOSIS — R030 Elevated blood-pressure reading, without diagnosis of hypertension: Secondary | ICD-10-CM | POA: Insufficient documentation

## 2016-01-26 DIAGNOSIS — R319 Hematuria, unspecified: Secondary | ICD-10-CM | POA: Diagnosis not present

## 2016-01-26 DIAGNOSIS — Z880 Allergy status to penicillin: Secondary | ICD-10-CM | POA: Insufficient documentation

## 2016-01-26 DIAGNOSIS — R110 Nausea: Secondary | ICD-10-CM | POA: Diagnosis not present

## 2016-01-26 DIAGNOSIS — R300 Dysuria: Secondary | ICD-10-CM | POA: Insufficient documentation

## 2016-01-26 DIAGNOSIS — E119 Type 2 diabetes mellitus without complications: Secondary | ICD-10-CM | POA: Insufficient documentation

## 2016-01-26 DIAGNOSIS — M545 Low back pain: Secondary | ICD-10-CM | POA: Insufficient documentation

## 2016-01-26 DIAGNOSIS — F419 Anxiety disorder, unspecified: Secondary | ICD-10-CM | POA: Insufficient documentation

## 2016-01-26 DIAGNOSIS — Z7984 Long term (current) use of oral hypoglycemic drugs: Secondary | ICD-10-CM | POA: Insufficient documentation

## 2016-01-26 DIAGNOSIS — R1032 Left lower quadrant pain: Secondary | ICD-10-CM | POA: Diagnosis not present

## 2016-01-26 DIAGNOSIS — Z8673 Personal history of transient ischemic attack (TIA), and cerebral infarction without residual deficits: Secondary | ICD-10-CM | POA: Insufficient documentation

## 2016-01-26 LAB — CBC
HEMATOCRIT: 40.4 % (ref 36.0–46.0)
Hemoglobin: 13.5 g/dL (ref 12.0–15.0)
MCH: 29.7 pg (ref 26.0–34.0)
MCHC: 33.4 g/dL (ref 30.0–36.0)
MCV: 88.8 fL (ref 78.0–100.0)
Platelets: 291 10*3/uL (ref 150–400)
RBC: 4.55 MIL/uL (ref 3.87–5.11)
RDW: 14.7 % (ref 11.5–15.5)
WBC: 6.5 10*3/uL (ref 4.0–10.5)

## 2016-01-26 LAB — COMPREHENSIVE METABOLIC PANEL
ALT: 18 U/L (ref 14–54)
AST: 24 U/L (ref 15–41)
Albumin: 4.5 g/dL (ref 3.5–5.0)
Alkaline Phosphatase: 63 U/L (ref 38–126)
Anion gap: 11 (ref 5–15)
BUN: 18 mg/dL (ref 6–20)
CHLORIDE: 105 mmol/L (ref 101–111)
CO2: 26 mmol/L (ref 22–32)
Calcium: 9.7 mg/dL (ref 8.9–10.3)
Creatinine, Ser: 0.87 mg/dL (ref 0.44–1.00)
GFR calc Af Amer: >60 mL/min (ref >60)
Glucose, Bld: 90 mg/dL (ref 65–99)
POTASSIUM: 3.8 mmol/L (ref 3.5–5.1)
SODIUM: 142 mmol/L (ref 135–145)
Total Bilirubin: 0.8 mg/dL (ref 0.3–1.2)
Total Protein: 7.8 g/dL (ref 6.5–8.1)

## 2016-01-26 LAB — URINALYSIS, ROUTINE W REFLEX MICROSCOPIC
Bilirubin Urine: NEGATIVE
Glucose, UA: NEGATIVE mg/dL
Hgb urine dipstick: NEGATIVE
KETONES UR: 40 mg/dL — AB
LEUKOCYTES UA: NEGATIVE
Nitrite: NEGATIVE
PROTEIN: NEGATIVE mg/dL
Specific Gravity, Urine: 1.024 (ref 1.005–1.030)
pH: 6 (ref 5.0–8.0)

## 2016-01-26 LAB — POC OCCULT BLOOD, ED: Fecal Occult Bld: NEGATIVE

## 2016-01-26 LAB — LIPASE, BLOOD: LIPASE: 27 U/L (ref 11–51)

## 2016-01-26 MED ORDER — ONDANSETRON 4 MG PO TBDP
4.0000 mg | ORAL_TABLET | Freq: Once | ORAL | Status: AC
  Administered 2016-01-26: 4 mg via ORAL
  Filled 2016-01-26: qty 1

## 2016-01-26 MED ORDER — ACETAMINOPHEN 325 MG PO TABS
650.0000 mg | ORAL_TABLET | Freq: Once | ORAL | Status: AC
  Administered 2016-01-26: 650 mg via ORAL
  Filled 2016-01-26: qty 2

## 2016-01-26 MED ORDER — ONDANSETRON 4 MG PO TBDP: 4.0000 mg | ORAL_TABLET | Freq: Three times a day (TID) | ORAL | Status: DC | PRN

## 2016-01-26 NOTE — ED Provider Notes (Signed)
CSN: [id number]     Arrival date & time 01/26/16  1735 History   First MD Initiated Contact with Patient 01/26/16 1958     Chief Complaint  Patient presents with  . Nausea  . Hypertension     (Consider location/radiation/quality/duration/timing/severity/associated sxs/prior Treatment) HPI   Blood pressure 128/79, pulse 102, temperature 98.2 F (36.8 C), temperature source Oral, resp. rate 16, SpO2 98 %.  Emily Carrillo is a 45 y.o. female with multiple complaints including low back pain, urinary frequency, hematuria, nausea, dysuria, elevated blood sugar at 198, elevated blood pressure 150/97, she has bilateral lower abdominal pain rated at 8 out of 10 and described as uncomfortable she is concerned that she has the flu she denies fever, cough, rhinorrhea, abnormal vaginal discharge. On review of systems patient notes chills.   Past Medical History  Diagnosis Date  . Anxiety   . Renal disorder   . Stroke (HCC)   . Diabetes mellitus without complication [facility])    Past Surgical History  Procedure Laterality Date  . Bladder tacking    . Abdominal hysterectomy     Family History  Problem Relation Age of Onset  . Cancer Mother   . Stroke Father   . Hypertension Father   . Diabetes Father    Social History  Substance Use Topics  . Smoking status: Never Smoker   . Smokeless tobacco: Never Used  . Alcohol Use: No   OB History    No data available     Review of Systems  10 systems reviewed and found to be negative, except as noted in the HPI.   Allergies  Imitrex; Amoxicillin; and Penicillins  Home Medications   Prior to Admission medications   Medication Sig Start Date End Date Taking? Authorizing Provider  acetaminophen (TYLENOL) 500 MG tablet Take 1,000 mg by mouth every 6 (six) hours as needed for headache.   Yes Historical Provider, MD  cyclobenzaprine (FLEXERIL) 10 MG tablet Take 1 tablet (10 mg total) by mouth 3 (three) times daily as needed for muscle spasms.  10/26/15  Yes Shari Upstill, PA-C  divalproex (DEPAKOTE ER) 500 MG 24 hr tablet Take 500 mg by mouth daily.   Yes Historical Provider, MD  hydrOXYzine (ATARAX/VISTARIL) 25 MG tablet Take 1 tablet (25 mg total) by mouth every 6 (six) hours. 01/25/15  Yes Tatyana Kirichenko, PA-C  lisinopril (PRINIVIL,ZESTRIL) 20 MG tablet Take 20 mg by mouth daily.   Yes Historical Provider, MD  metFORMIN (GLUCOPHAGE) 500 MG tablet Take 500 mg by mouth 2 (two) times daily with a meal.   Yes Historical Provider, MD  methylphenidate (RITALIN) 20 MG tablet Take 20 mg by mouth 2 (two) times daily.   Yes Historical Provider, MD  naproxen sodium (ANAPROX) 220 MG tablet Take 220 mg by mouth 2 (two) times daily as needed (headache).   Yes Historical Provider, MD  OVER THE COUNTER MEDICATION Take 1 tablet by mouth daily.   Yes Historical Provider, MD  VITAMIN E PO Take 2 capsules by mouth daily.    Yes Historical Provider, MD  zolpidem (AMBIEN) 10 MG tablet Take 10 mg by mouth at bedtime as needed for sleep.   Yes Historical Provider, MD  azithromycin (ZITHROMAX) 250 MG tablet Take 1 tablet (250 mg total) by mouth daily. Take first 2 tablets together, then 1 every day until finished. Patient not taking: Reported on 01/26/2016 12/08/15   Trixie Dredge, PA-C  benzonatate (TESSALON) 100 MG capsule Take 1 capsule (100 mg  total) by mouth 3 (three) times daily as needed for cough. Patient not taking: Reported on 01/26/2016 12/08/15   Trixie Dredge, PA-C  famotidine (PEPCID) 20 MG tablet Take 1 tablet (20 mg total) by mouth 2 (two) times daily. Patient not taking: Reported on 01/26/2016 01/25/15   Jaynie Crumble, PA-C  ibuprofen (ADVIL,MOTRIN) 800 MG tablet Take 1 tablet (800 mg total) by mouth 3 (three) times daily. Patient not taking: Reported on 01/26/2016 10/26/15   Elpidio Anis, PA-C  loratadine (CLARITIN) 10 MG tablet Take 1 tablet (10 mg total) by mouth daily. Patient not taking: Reported on 01/26/2016 10/25/14   Loren Racer, MD   mometasone (NASONEX) 50 MCG/ACT nasal spray Place 2 sprays into the nose daily. Patient not taking: Reported on 01/26/2016 10/25/14   Loren Racer, MD  naproxen (NAPROSYN) 500 MG tablet Take 1 tablet (500 mg total) by mouth 2 (two) times daily with a meal. Patient not taking: Reported on 01/26/2016 07/08/15   Francee Piccolo, PA-C  oxymetazoline (AFRIN NASAL SPRAY) 0.05 % nasal spray Place 1 spray into both nostrils 2 (two) times daily. Patient not taking: Reported on 01/26/2016 10/25/14   Loren Racer, MD  predniSONE (DELTASONE) 20 MG tablet Take 2 tablets (40 mg total) by mouth daily. Patient not taking: Reported on 01/26/2016 01/25/15   Tatyana Kirichenko, PA-C   BP 128/79 mmHg  Pulse 102  Temp(Src) 98.2 F (36.8 C) (Oral)  Resp 16  SpO2 98% Physical Exam  Constitutional: She is oriented to person, place, and time. She appears well-developed and well-nourished. No distress.  HENT:  Head: Normocephalic and atraumatic.  Mouth/Throat: Oropharynx is clear and moist.  Eyes: Conjunctivae and EOM are normal. Pupils are equal, round, and reactive to light.  Neck: Normal range of motion.  Cardiovascular: Normal rate, regular rhythm and intact distal pulses.   Pulmonary/Chest: Effort normal and breath sounds normal. No stridor. No respiratory distress. She has no wheezes. She has no rales. She exhibits no tenderness.  Abdominal: Soft. Bowel sounds are normal. She exhibits no distension and no mass. There is no tenderness. There is no rebound and no guarding.  Genitourinary:  No CVA tenderness to percussion bilaterally  Musculoskeletal: Normal range of motion.  Neurological: She is alert and oriented to person, place, and time.  No point tenderness to percussion of lumbar spinal processes.  No TTP or paraspinal muscular spasm. Strength is 5 out of 5 to bilateral lower extremities at hip and knee; extensor hallucis longus 5 out of 5. Ankle strength 5 out of 5. Patellar reflexes are 2+  bilaterally.      Skin: She is not diaphoretic.  Psychiatric: She has a normal mood and affect.  Nursing note and vitals reviewed.   ED Course  Procedures (including critical care time) Labs Review Labs Reviewed  URINALYSIS, ROUTINE W REFLEX MICROSCOPIC (NOT AT [facility]) - Abnormal; Notable for the following:    Ketones, ur 40 (*)    All other components within normal limits  LIPASE, BLOOD  COMPREHENSIVE METABOLIC PANEL  CBC  POC OCCULT BLOOD, ED    Imaging Review No results found. I have personally reviewed and evaluated these images and lab results as part of my medical decision-making.   EKG Interpretation None      MDM   Final diagnoses:  Nausea    Filed Vitals:   01/26/16 1818  BP: 128/79  Pulse: 102  Temp: 98.2 F (36.8 C)  TempSrc: Oral  Resp: 16  SpO2: 98%  Medications  acetaminophen (TYLENOL) tablet 650 mg (not administered)    Emily Carrillo is 45 y.o. female presenting with elevated blood sugar of 198 and elevated blood pressure. She also has a list of other complaints including nausea and low back pain. Reports lower abdominal discomfort, abdominal exam with no focal tenderness. Blood work reassuring, urinalysis is not consistent with infection however there are 40 ketones, this may be the cause of her nausea. I've encouraged her to push fluids, will write her a prescription for Zofran and follow closely with PCP.  Evaluation does not show pathology that would require ongoing emergent intervention or inpatient treatment. Pt is hemodynamically stable and mentating appropriately. Discussed findings and plan with patient/guardian, who agrees with care plan. All questions answered. Return precautions discussed and outpatient follow up given.   Discharge Medication List as of 01/26/2016 10:14 PM    START taking these medications   Details  ondansetron (ZOFRAN ODT) 4 MG disintegrating tablet Take 1 tablet (4 mg total) by mouth every 8 (eight) hours as  needed for nausea or vomiting., Starting 01/26/2016, Until Discontinued, [employer], PA-C 01/26/16 1610  Gwyneth Sprout, MD 01/27/16 9604

## 2016-01-26 NOTE — ED Notes (Signed)
Pt c/o blurred vision x 2 days, low abdominal pain, hematuria, nausea, low medial back pain, dysuria.

## 2016-01-26 NOTE — ED Notes (Signed)
EDP at bedside  

## 2016-01-26 NOTE — Discharge Instructions (Signed)
Push fluids: take small frequent sips of water or Gatorade, do not drink any soda, juice or caffeinated beverages.    Please follow with your primary care doctor in the next 2 days for a check-up. They must obtain records for further management.   Do not hesitate to return to the Emergency Department for any new, worsening or concerning symptoms.    Nausea, Adult Nausea is the feeling that you have an upset stomach or have to vomit. Nausea by itself is not likely a serious concern, but it may be an early sign of more serious medical problems. As nausea gets worse, it can lead to vomiting. If vomiting develops, there is the risk of dehydration.  CAUSES   Viral infections.  Food poisoning.  Medicines.  Pregnancy.  Motion sickness.  Migraine headaches.  Emotional distress.  Severe pain from any source.  Alcohol intoxication. HOME CARE INSTRUCTIONS  Get plenty of rest.  Ask your caregiver about specific rehydration instructions.  Eat small amounts of food and sip liquids more often.  Take all medicines as told by your caregiver. SEEK MEDICAL CARE IF:  You have not improved after 2 days, or you get worse.  You have a headache. SEEK IMMEDIATE MEDICAL CARE IF:   You have a fever.  You faint.  You keep vomiting or have blood in your vomit.  You are extremely weak or dehydrated.  You have dark or bloody stools.  You have severe chest or abdominal pain. MAKE SURE YOU:  Understand these instructions.  Will watch your condition.  Will get help right away if you are not doing well or get worse.   This information is not intended to replace advice given to you by your health care provider. Make sure you discuss any questions you have with your health care provider.   Document Released: 01/14/2005 Document Revised: 12/28/2014 Document Reviewed: 08/19/2011 Elsevier Interactive Patient Education [employer]

## 2016-06-18 ENCOUNTER — Emergency Department (HOSPITAL_COMMUNITY)
Admission: EM | Admit: 2016-06-18 | Discharge: 2016-06-18 | Disposition: A | Discharge status: Home / Self Care | Payer: BC Managed Care – PPO | Attending: Emergency Medicine | Admitting: Emergency Medicine

## 2016-06-18 ENCOUNTER — Encounter (HOSPITAL_COMMUNITY): Payer: Self-pay

## 2016-06-18 DIAGNOSIS — Z5321 Procedure and treatment not carried out due to patient leaving prior to being seen by health care provider: Secondary | ICD-10-CM | POA: Insufficient documentation

## 2016-06-18 DIAGNOSIS — R42 Dizziness and giddiness: Secondary | ICD-10-CM | POA: Insufficient documentation

## 2016-06-18 LAB — CBG MONITORING, ED: Glucose-Capillary: 132 mg/dL — ABNORMAL HIGH (ref 65–99)

## 2016-06-18 NOTE — ED Notes (Addendum)
Pt presents with c/o dizziness that started earlier today, pt reports a hx of vertigo and reports that this is how she normally feels when she has an episode with her vertigo. Pt also reports a bite on her leg that she would like to have looked at. Neuro exam unremarkable.

## 2016-06-18 NOTE — ED Notes (Signed)
Pt c/o of wait time, wanting to leave Explained delay to pt Pt encouraged to stay Pt chose to leave

## 2016-10-11 ENCOUNTER — Encounter (HOSPITAL_COMMUNITY): Payer: Self-pay | Admitting: Emergency Medicine

## 2016-10-11 ENCOUNTER — Emergency Department (HOSPITAL_COMMUNITY): Payer: BC Managed Care – PPO

## 2016-10-11 ENCOUNTER — Emergency Department (HOSPITAL_COMMUNITY)
Admission: EM | Admit: 2016-10-11 | Discharge: 2016-10-12 | Disposition: A | Discharge status: Home / Self Care | Payer: BC Managed Care – PPO | Attending: Emergency Medicine | Admitting: Emergency Medicine

## 2016-10-11 DIAGNOSIS — R51 Headache: Secondary | ICD-10-CM | POA: Insufficient documentation

## 2016-10-11 DIAGNOSIS — R0789 Other chest pain: Secondary | ICD-10-CM | POA: Diagnosis not present

## 2016-10-11 DIAGNOSIS — R531 Weakness: Secondary | ICD-10-CM | POA: Insufficient documentation

## 2016-10-11 DIAGNOSIS — Z79899 Other long term (current) drug therapy: Secondary | ICD-10-CM | POA: Insufficient documentation

## 2016-10-11 DIAGNOSIS — Z7984 Long term (current) use of oral hypoglycemic drugs: Secondary | ICD-10-CM | POA: Insufficient documentation

## 2016-10-11 DIAGNOSIS — E119 Type 2 diabetes mellitus without complications: Secondary | ICD-10-CM | POA: Diagnosis not present

## 2016-10-11 DIAGNOSIS — R519 Headache, unspecified: Secondary | ICD-10-CM

## 2016-10-11 HISTORY — DX: Migraine, unspecified, not intractable, without status migrainosus: G43.909

## 2016-10-11 LAB — URINE MICROSCOPIC-ADD ON

## 2016-10-11 LAB — URINALYSIS, ROUTINE W REFLEX MICROSCOPIC
BILIRUBIN URINE: NEGATIVE
Glucose, UA: NEGATIVE mg/dL
KETONES UR: NEGATIVE mg/dL
Leukocytes, UA: NEGATIVE
Nitrite: NEGATIVE
PROTEIN: NEGATIVE mg/dL
Specific Gravity, Urine: 1.023 (ref 1.005–1.030)
pH: 6.5 (ref 5.0–8.0)

## 2016-10-11 LAB — CBC
HCT: 40.5 % (ref 36.0–46.0)
HEMOGLOBIN: 13.9 g/dL (ref 12.0–15.0)
MCH: 29.8 pg (ref 26.0–34.0)
MCHC: 34.3 g/dL (ref 30.0–36.0)
MCV: 86.7 fL (ref 78.0–100.0)
PLATELETS: 269 10*3/uL (ref 150–400)
RBC: 4.67 MIL/uL (ref 3.87–5.11)
RDW: 14.3 % (ref 11.5–15.5)
WBC: 6.7 10*3/uL (ref 4.0–10.5)

## 2016-10-11 LAB — BASIC METABOLIC PANEL
ANION GAP: 8 (ref 5–15)
BUN: 20 mg/dL (ref 6–20)
CALCIUM: 9.3 mg/dL (ref 8.9–10.3)
CO2: 23 mmol/L (ref 22–32)
Chloride: 104 mmol/L (ref 101–111)
Creatinine, Ser: 0.92 mg/dL (ref 0.44–1.00)
GFR calc Af Amer: >60 mL/min (ref >60)
GLUCOSE: 86 mg/dL (ref 65–99)
Potassium: 5 mmol/L (ref 3.5–5.1)
Sodium: 135 mmol/L (ref 135–145)

## 2016-10-11 LAB — TROPONIN I

## 2016-10-11 LAB — CBG MONITORING, ED: GLUCOSE-CAPILLARY: 87 mg/dL (ref 65–99)

## 2016-10-11 MED ORDER — SODIUM CHLORIDE 0.9 % IV BOLUS (SEPSIS)
1000.0000 mL | Freq: Once | INTRAVENOUS | Status: AC
  Administered 2016-10-11: 1000 mL via INTRAVENOUS

## 2016-10-11 MED ORDER — METOCLOPRAMIDE HCL 5 MG/ML IJ SOLN
10.0000 mg | INTRAMUSCULAR | Status: AC
Start: 2016-10-11 — End: 2016-10-11
  Administered 2016-10-11: 10 mg via INTRAVENOUS
  Filled 2016-10-11: qty 2

## 2016-10-11 MED ORDER — DIPHENHYDRAMINE HCL 50 MG/ML IJ SOLN
25.0000 mg | Freq: Once | INTRAMUSCULAR | Status: AC
  Administered 2016-10-11: 25 mg via INTRAVENOUS
  Filled 2016-10-11: qty 1

## 2016-10-11 MED ORDER — DEXAMETHASONE SODIUM PHOSPHATE 10 MG/ML IJ SOLN
10.0000 mg | Freq: Once | INTRAMUSCULAR | Status: AC
  Administered 2016-10-11: 10 mg via INTRAVENOUS
  Filled 2016-10-11: qty 1

## 2016-10-11 NOTE — ED Provider Notes (Signed)
[facility]-EMERGENCY DEPT Provider Note   CSN: [id number] Arrival date & time: 10/11/16  1816 By signing my name below, I, Emily Carrillo, attest that this documentation has been prepared under the direction and in the presence of non-physician practitioner, Danelle Berry, PA-C  Electronically Signed: Levon Carrillo, Scribe. 10/11/2016. 8:36 PM.   History   Chief Complaint Chief Complaint  Patient presents with  . Chest Pain  . Weakness  . Eye Problem   HPI AMBREA Carrillo is a 45 y.o. female with hx of anxiety, DM, stroke and migraine who presents to the Emergency Department complaining of sudden onset, worsening headache onset yesterday at 11 am.  HA is located all over her head, radiates to her left side of her face and neck.  She describes the pain as pounding, as if "someone is hitting her over the head" and rates it as a 10/10 in severity. She states she hasn't had a headache in a while and has never had one this severe.  HA's in the past are more throbbing.  She has associated generalized weakness, photophobia, bilateral blurry vision described as "haze over her eyes", sweats, muscle spasms and nausea. She woke this morning with much more severe headache then began yesterday, she also had new left eye pain and redness, with sensation of foreign body.  Eye pain is exacerbated by eye movement.  The severity of her headache has caused her to be slightly anxious and she describes palpitations, hyperventilation and associated chest pain. She took a tramadol today at 1 pm with no relief. Pt denies any vomiting, tinnitus, dysuria, or hematuria. She is concerned because her aunt and uncle have both had aneurysms. She is not currently followed by a neurologist. She denies fevers, neck stiffness.  She has right low back pain with radiation down her leg, unrelated to HA.    The history is provided by the patient. No language interpreter was used.   Past Medical History:  Diagnosis Date  . Anxiety   .  Diabetes mellitus without complication (HCC)   . Migraine   . Renal disorder   . Stroke [facility])    There are no active problems to display for this patient.  Past Surgical History:  Procedure Laterality Date  . ABDOMINAL HYSTERECTOMY    . Bladder tacking      OB History    No data available     Home Medications    Prior to Admission medications   Medication Sig Start Date End Date Taking? Authorizing Provider  divalproex (DEPAKOTE ER) 500 MG 24 hr tablet Take 500 mg by mouth daily.   Yes Historical Provider, MD  lisinopril (PRINIVIL,ZESTRIL) 20 MG tablet Take 20 mg by mouth daily.   Yes Historical Provider, MD  metFORMIN (GLUCOPHAGE) 500 MG tablet Take 500 mg by mouth 2 (two) times daily with a meal.   Yes Historical Provider, MD  methylphenidate (RITALIN) 20 MG tablet Take 20 mg by mouth 2 (two) times daily.   Yes Historical Provider, MD  acetaminophen (TYLENOL) 500 MG tablet Take 1,000 mg by mouth every 6 (six) hours as needed for headache.    Historical Provider, MD  azithromycin (ZITHROMAX) 250 MG tablet Take 1 tablet (250 mg total) by mouth daily. Take first 2 tablets together, then 1 every day until finished. Patient not taking: Reported on 10/11/2016 12/08/15   Trixie Dredge, PA-C  benzonatate (TESSALON) 100 MG capsule Take 1 capsule (100 mg total) by mouth 3 (three) times daily as needed for  cough. Patient not taking: Reported on 10/11/2016 12/08/15   Trixie DredgeEmily West, PA-C  cyclobenzaprine (FLEXERIL) 10 MG tablet Take 1 tablet (10 mg total) by mouth 3 (three) times daily as needed for muscle spasms. Patient not taking: Reported on 10/11/2016 10/26/15   Elpidio AnisShari Upstill, PA-C  famotidine (PEPCID) 20 MG tablet Take 1 tablet (20 mg total) by mouth 2 (two) times daily. Patient not taking: Reported on 10/11/2016 01/25/15   Jaynie Crumbleatyana Kirichenko, PA-C  hydrOXYzine (ATARAX/VISTARIL) 25 MG tablet Take 1 tablet (25 mg total) by mouth every 6 (six) hours. Patient not taking: Reported on 10/11/2016  01/25/15   Jaynie Crumbleatyana Kirichenko, PA-C  ibuprofen (ADVIL,MOTRIN) 800 MG tablet Take 1 tablet (800 mg total) by mouth 3 (three) times daily. Patient not taking: Reported on 10/11/2016 10/26/15   Elpidio AnisShari Upstill, PA-C  loratadine (CLARITIN) 10 MG tablet Take 1 tablet (10 mg total) by mouth daily. Patient not taking: Reported on 10/11/2016 10/25/14   Loren Raceravid Yelverton, MD  mometasone (NASONEX) 50 MCG/ACT nasal spray Place 2 sprays into the nose daily. Patient not taking: Reported on 10/11/2016 10/25/14   Loren Raceravid [place]el[place], MD  naproxen (NAPROSYN) 500 MG tablet Take 1 tablet (500 mg total) by mouth 2 (two) times daily with a meal. Patient not taking: Reported on 10/11/2016 07/08/15   Francee PiccoloJennifer Piepenbrink, PA-C  naproxen sodium (ANAPROX) 220 MG tablet Take 220 mg by mouth 2 (two) times daily as needed (headache).    Historical Provider, MD  ondansetron (ZOFRAN ODT) 4 MG disintegrating tablet Take 1 tablet (4 mg total) by mouth every 8 (eight) hours as needed for nausea or vomiting. 01/26/16   Nicole Pisciotta, PA-C  oxymetazoline (AFRIN NASAL SPRAY) 0.05 % nasal spray Place 1 spray into both nostrils 2 (two) times daily. Patient not taking: Reported on 10/11/2016 10/25/14   Loren Raceravid Yelverton, MD  predniSONE (DELTASONE) 20 MG tablet Take 2 tablets (40 mg total) by mouth daily. Patient not taking: Reported on 10/11/2016 01/25/15   Tatyana Kirichenko, PA-C  zolpidem (AMBIEN) 10 MG tablet Take 10 mg by mouth at bedtime as needed for sleep.    Historical Provider, MD    Family History Family History  Problem Relation Age of Onset  . Cancer Mother   . Stroke Father   . Hypertension Father   . Diabetes Father     Social History Social History  Substance Use Topics  . Smoking status: Never Smoker  . Smokeless tobacco: Never Used  . Alcohol use No   Allergies   Imitrex [sumatriptan]; Amoxicillin; and Penicillins  Review of Systems Review of Systems  10 systems reviewed and all are negative for acute change except  as noted in the HPI.   Physical Exam Updated Vital Signs BP 107/64   Pulse 83   Temp 98.2 F (36.8 C) (Oral)   Resp 14   Ht 5\' 4"  (1.626 m)   Wt 86.2 kg   SpO2 95%   BMI 32.61 kg/m   Physical Exam  Constitutional: She is oriented to person, place, and time. She appears well-developed and well-nourished. No distress.  HENT:  Head: Normocephalic and atraumatic.  Right Ear: External ear normal.  Left Ear: External ear normal.  Nose: Nose normal.  Mouth/Throat: Oropharynx is clear and moist. No oropharyngeal exudate.  Eyes: EOM are normal. Pupils are equal, round, and reactive to light. Right eye exhibits no discharge. Left eye exhibits no discharge.  Left lateral conjunctival hemorrhage   Neck: Normal range of motion and full passive range of motion  without pain. Neck supple. No spinous process tenderness and no muscular tenderness present. Normal range of motion present. No Brudzinski's sign and no Kernig's sign noted.  Cardiovascular: Normal rate, regular rhythm, normal heart sounds and intact distal pulses.  Exam reveals no gallop and no friction rub.   No murmur heard. Pulmonary/Chest: Effort normal and breath sounds normal. No stridor. No respiratory distress. She has no wheezes. She has no rales. She exhibits no tenderness.  Abdominal: Soft. Bowel sounds are normal. She exhibits no distension and no mass. There is no tenderness. There is no rebound and no guarding. No hernia.  Musculoskeletal: Normal range of motion.  Neurological: She is alert and oriented to person, place, and time. She is not disoriented. No cranial nerve deficit or sensory deficit. She exhibits normal muscle tone. Coordination normal. GCS eye subscore is 4. GCS verbal subscore is 5. GCS motor subscore is 6.  Mild weakness with strength testing in all extremities with bilateral grip strength, bilateral flexion and extension at the elbows, and bilateral dorsiflexion and plantarflexion.  Patient can lift both legs  up off the ER gurney Normal sensation to light touch in all extremities Normal coordination Gait testing deferred  Skin: Skin is warm and dry. No rash noted.  Psychiatric: She has a normal mood and affect.  Nursing note and vitals reviewed.  ED Treatments / Results  DIAGNOSTIC STUDIES:  Oxygen Saturation is 98% on RA, normal by my interpretation.    COORDINATION OF CARE:  8:33 PM Discussed treatment plan with pt at bedside and pt agreed to plan.   Labs (all labs ordered are listed, but only abnormal results are displayed) Labs Reviewed  URINALYSIS, ROUTINE W REFLEX MICROSCOPIC (NOT AT [facility]) - Abnormal; Notable for the following:       Result Value   Hgb urine dipstick TRACE (*)    All other components within normal limits  URINE MICROSCOPIC-ADD ON - Abnormal; Notable for the following:    Squamous Epithelial / LPF 0-5 (*)    Bacteria, UA RARE (*)    All other components within normal limits  CSF CULTURE  BASIC METABOLIC PANEL  CBC  TROPONIN I  GLUCOSE, CSF  PROTEIN, CSF  CSF CELL COUNT WITH DIFFERENTIAL  CSF CELL COUNT WITH DIFFERENTIAL  CBG MONITORING, ED   EKG  EKG Interpretation None      Radiology Dg Chest 2 View  Result Date: 10/11/2016 CLINICAL DATA:  Headache and generalized weakness. Chest pain and palpitations. EXAM: CHEST  2 VIEW COMPARISON:  10/26/2015 FINDINGS: The heart size and mediastinal contours are within normal limits. Both lungs are clear. The visualized skeletal structures are unremarkable. IMPRESSION: No active cardiopulmonary disease. Electronically Signed   By: Elberta Fortis M.D.   On: 10/11/2016 19:47   Ct Head Wo Contrast  Result Date: 10/12/2016 CLINICAL DATA:  Anxiety, worsening headaches EXAM: CT HEAD WITHOUT CONTRAST TECHNIQUE: Contiguous axial images were obtained from the base of the skull through the vertex without intravenous contrast. COMPARISON:  08/03/2010 FINDINGS: Brain: No evidence of acute infarction, hemorrhage,  hydrocephalus, extra-axial collection or mass lesion/mass effect. Vascular: No hyperdense vessel or unexpected calcification. Skull: Normal. Negative for fracture or focal lesion. Sinuses/Orbits: Mild mucosal thickening in the ethmoid sinuses. Angular deformity of the bony nasal septum. Other: None IMPRESSION: No CT evidence for acute intracranial abnormality Electronically Signed   By: Jasmine Pang M.D.   On: 10/12/2016 00:45    Procedures Procedures (including critical care time)  Medications Ordered in ED  Medications  sodium chloride 0.9 % bolus 1,000 mL (0 mLs Intravenous Stopped 10/11/16 2311)  metoCLOPramide (REGLAN) injection 10 mg (10 mg Intravenous Given 10/11/16 2057)  diphenhydrAMINE (BENADRYL) injection 25 mg (25 mg Intravenous Given 10/11/16 2057)  dexamethasone (DECADRON) injection 10 mg (10 mg Intravenous Given 10/11/16 2057)  lidocaine (PF) (XYLOCAINE) 1 % injection 5 mL (5 mLs Infiltration Given by Other 10/12/16 0229)  metoCLOPramide (REGLAN) injection 10 mg (10 mg Intravenous Given 10/12/16 0313)  diphenhydrAMINE (BENADRYL) injection 25 mg (25 mg Intravenous Given 10/12/16 0313)  morphine 4 MG/ML injection 4 mg (4 mg Intravenous Given 10/12/16 0314)    Initial Impression / Assessment and Plan / ED Course  I have reviewed the triage vital signs and the nursing notes.  Pertinent labs & imaging results that were available during my care of the patient were reviewed by me and considered in my medical decision making (see chart for details).  Clinical Course   45 year old female presents to the ER with severe, sudden onset generalized headache began roughly 36 hours ago, constant, associated with blurry vision, photophobia.  She is concerned with a history of aneurysms in her family.  She reports this headache is not consistent with past migraines, different quality and different severity.    Presentation concerning for [facility], discussed case with Dr. Rubin Payor, he advises  screening CT.  Patient has multiple MRIs of her brain in the past, no angio or MRA.  CT head negative.  Discussed case with Dr. Jacqulyn Bath, advised fluoro guided LP.  Will be transferred to [facility] for diagnostic LP.  Discussed case with radiologist Dr. Herschel Senegal, Dr. Preston Fleeting from [facility] ED accepting tx, Dr. Jacqulyn Bath has done EMTALA.  Pt's pain improved minimally to 8/10 after multiple doses of IVF, reglan, benadryl, decadron, and morphine.    Final Clinical Impressions(s) / ED Diagnoses   Final diagnoses:  None   New Prescriptions New Prescriptions   No medications on file   I personally performed the services described in this documentation, which was scribed in my presence. The recorded information has been reviewed and is accurate.      Danelle Berry, PA-C 10/12/16 1003    Maia Plan, MD 10/12/16 [phone]

## 2016-10-11 NOTE — ED Notes (Signed)
Unsuccessful attempt to draw labs. 

## 2016-10-11 NOTE — ED Triage Notes (Addendum)
Pt states she woke up this morning at approx. 9am to her L eye hurting and red with a headache and generalized, bilateral weakness. Pt also c/o chest pain and palpitations. Alert and oriented. Neuro intact.

## 2016-10-11 NOTE — ED Notes (Signed)
Pt transported to Xray. 

## 2016-10-12 ENCOUNTER — Emergency Department (HOSPITAL_COMMUNITY): Payer: BC Managed Care – PPO

## 2016-10-12 LAB — CSF CELL COUNT WITH DIFFERENTIAL
RBC COUNT CSF: 1 /mm3 — AB
Tube #: 3
WBC CSF: 1 /mm3 (ref 0–5)

## 2016-10-12 LAB — GLUCOSE, CSF: GLUCOSE CSF: 82 mg/dL — AB (ref 40–70)

## 2016-10-12 LAB — PROTEIN, CSF: Total  Protein, CSF: 36 mg/dL (ref 15–45)

## 2016-10-12 MED ORDER — LIDOCAINE HCL (PF) 1 % IJ SOLN
5.0000 mL | Freq: Once | INTRAMUSCULAR | Status: AC
  Administered 2016-10-12: 5 mL
  Filled 2016-10-12: qty 30

## 2016-10-12 MED ORDER — MORPHINE SULFATE (PF) 4 MG/ML IV SOLN
4.0000 mg | Freq: Once | INTRAVENOUS | Status: AC
  Administered 2016-10-12: 4 mg via INTRAVENOUS
  Filled 2016-10-12: qty 1

## 2016-10-12 MED ORDER — HYDROCODONE-ACETAMINOPHEN 5-325 MG PO TABS
2.0000 | ORAL_TABLET | Freq: Once | ORAL | Status: AC
  Administered 2016-10-12: 2 via ORAL
  Filled 2016-10-12: qty 2

## 2016-10-12 MED ORDER — DIPHENHYDRAMINE HCL 50 MG/ML IJ SOLN
25.0000 mg | Freq: Once | INTRAMUSCULAR | Status: AC
  Administered 2016-10-12: 25 mg via INTRAVENOUS
  Filled 2016-10-12: qty 1

## 2016-10-12 MED ORDER — METOCLOPRAMIDE HCL 5 MG/ML IJ SOLN
10.0000 mg | INTRAMUSCULAR | Status: AC
  Administered 2016-10-12: 10 mg via INTRAVENOUS
  Filled 2016-10-12: qty 2

## 2016-10-12 MED ORDER — HYDROMORPHONE HCL 2 MG/ML IJ SOLN
1.0000 mg | Freq: Once | INTRAMUSCULAR | Status: AC
  Administered 2016-10-12: 1 mg via INTRAVENOUS
  Filled 2016-10-12: qty 1

## 2016-10-12 NOTE — ED Notes (Signed)
[facility] here to transfer pt to [facility]. 

## 2016-10-12 NOTE — ED Provider Notes (Signed)
I received pt in signout from Dr. Preston FleetingGlick. She is transferred for an IR lumbar puncture to evaluate for blood, concern for subarachnoid hemorrhage. CSF studies show no significant blood or xanthochromia. Patient well-appearing on reexamination. Discussed supportive care after lumbar puncture and reviewed return precautions. She voiced understanding and was discharged in satisfactory condition.   Laurence Spatesachel Morgan Little, MD 10/12/16 [phone]

## 2016-10-12 NOTE — ED Notes (Signed)
Pt would like some water since she is back from the procedure.

## 2016-10-12 NOTE — Discharge Instructions (Signed)
Please return immediately if you have a fever, problems with her vision, extremity numbness or weakness, confusion, all or any new symptoms.

## 2016-10-12 NOTE — Progress Notes (Signed)
LP under fluoro.  No complications.  Clear colorless fluid sent for requested labs.  OP slightly elevated 25 cm H2O.

## 2016-10-12 NOTE — ED Notes (Signed)
Patient resting at this time.

## 2016-10-12 NOTE — ED Notes (Signed)
RN called [facility] and spoke with staff over there who state there is no order. RN then spoke with Dr. Clarene DukeLittle for order. RN called [facility] to see ETA. Staff states full schedule and will have MD call Dr. Clarene DukeLittle.

## 2016-10-12 NOTE — ED Notes (Signed)
Pt returned from [facility]

## 2016-10-12 NOTE — ED Provider Notes (Signed)
[facility] for fluoroscopic guided lumbar puncture because of headache. Headache is been present for nearly 2 days. She had slight, temporary relief with metoclopramide and morphine. On exam, there is no meningismus and she is resting comfortably. She'll be given additional morphine. Radiology has been consulted for lumbar puncture.   Dione Boozeavid Jill Ruppe, MD 10/12/16 [phone]

## 2016-10-12 NOTE — ED Notes (Signed)
[facility] was notified of pt's transfer to [facility]. 

## 2016-10-12 NOTE — ED Notes (Addendum)
PT transported to xray at this time. No signs of distress; pain improved.

## 2016-10-12 NOTE — ED Notes (Signed)
Patient given water

## 2016-10-12 NOTE — ED Notes (Signed)
Pt complaining of headache and chest pain; MD informed.

## 2016-10-15 LAB — CSF CULTURE W GRAM STAIN: Culture: NO GROWTH

## 2016-10-15 LAB — CSF CULTURE: GRAM STAIN: NONE SEEN

## 2017-04-14 ENCOUNTER — Emergency Department (HOSPITAL_COMMUNITY)
Admission: EM | Admit: 2017-04-14 | Discharge: 2017-04-14 | Disposition: A | Discharge status: Home / Self Care | Payer: BC Managed Care – PPO | Attending: Emergency Medicine | Admitting: Emergency Medicine

## 2017-04-14 ENCOUNTER — Encounter (HOSPITAL_COMMUNITY): Payer: Self-pay

## 2017-04-14 ENCOUNTER — Emergency Department (HOSPITAL_COMMUNITY): Payer: BC Managed Care – PPO

## 2017-04-14 DIAGNOSIS — E119 Type 2 diabetes mellitus without complications: Secondary | ICD-10-CM | POA: Insufficient documentation

## 2017-04-14 DIAGNOSIS — Z8673 Personal history of transient ischemic attack (TIA), and cerebral infarction without residual deficits: Secondary | ICD-10-CM | POA: Insufficient documentation

## 2017-04-14 DIAGNOSIS — Z7984 Long term (current) use of oral hypoglycemic drugs: Secondary | ICD-10-CM | POA: Diagnosis not present

## 2017-04-14 DIAGNOSIS — R3129 Other microscopic hematuria: Secondary | ICD-10-CM | POA: Insufficient documentation

## 2017-04-14 DIAGNOSIS — R319 Hematuria, unspecified: Secondary | ICD-10-CM | POA: Diagnosis present

## 2017-04-14 DIAGNOSIS — R300 Dysuria: Secondary | ICD-10-CM

## 2017-04-14 LAB — URINALYSIS, ROUTINE W REFLEX MICROSCOPIC
BACTERIA UA: NONE SEEN
BILIRUBIN URINE: NEGATIVE
Glucose, UA: 50 mg/dL — AB
Ketones, ur: NEGATIVE mg/dL
NITRITE: NEGATIVE
PROTEIN: 100 mg/dL — AB
SPECIFIC GRAVITY, URINE: 1.024 (ref 1.005–1.030)
Squamous Epithelial / LPF: NONE SEEN
pH: 6 (ref 5.0–8.0)

## 2017-04-14 LAB — COMPREHENSIVE METABOLIC PANEL
ALT: 21 U/L (ref 14–54)
AST: 31 U/L (ref 15–41)
Albumin: 4.7 g/dL (ref 3.5–5.0)
Alkaline Phosphatase: 70 U/L (ref 38–126)
Anion gap: 10 (ref 5–15)
BUN: 14 mg/dL (ref 6–20)
CHLORIDE: 105 mmol/L (ref 101–111)
CO2: 24 mmol/L (ref 22–32)
Calcium: 9.5 mg/dL (ref 8.9–10.3)
Creatinine, Ser: 0.8 mg/dL (ref 0.44–1.00)
Glucose, Bld: 107 mg/dL — ABNORMAL HIGH (ref 65–99)
POTASSIUM: 3.8 mmol/L (ref 3.5–5.1)
SODIUM: 139 mmol/L (ref 135–145)
Total Bilirubin: 0.8 mg/dL (ref 0.3–1.2)
Total Protein: 8 g/dL (ref 6.5–8.1)

## 2017-04-14 LAB — CBC
HEMATOCRIT: 41 % (ref 36.0–46.0)
Hemoglobin: 13.7 g/dL (ref 12.0–15.0)
MCH: 29.4 pg (ref 26.0–34.0)
MCHC: 33.4 g/dL (ref 30.0–36.0)
MCV: 88 fL (ref 78.0–100.0)
PLATELETS: 334 10*3/uL (ref 150–400)
RBC: 4.66 MIL/uL (ref 3.87–5.11)
RDW: 14.7 % (ref 11.5–15.5)
WBC: 9.5 10*3/uL (ref 4.0–10.5)

## 2017-04-14 LAB — LIPASE, BLOOD: LIPASE: 21 U/L (ref 11–51)

## 2017-04-14 MED ORDER — KETOROLAC TROMETHAMINE 30 MG/ML IJ SOLN
30.0000 mg | Freq: Once | INTRAMUSCULAR | Status: AC
  Administered 2017-04-14: 30 mg via INTRAVENOUS
  Filled 2017-04-14: qty 1

## 2017-04-14 MED ORDER — HYDROCODONE-ACETAMINOPHEN 5-325 MG PO TABS: 1.0000 | ORAL_TABLET | Freq: Four times a day (QID) | ORAL | 0 refills | Status: DC | PRN

## 2017-04-14 MED ORDER — NITROFURANTOIN MONOHYD MACRO 100 MG PO CAPS
100.0000 mg | ORAL_CAPSULE | Freq: Two times a day (BID) | ORAL | 0 refills | Status: DC
Start: 2017-04-14 — End: 2020-03-12

## 2017-04-14 MED ORDER — SODIUM CHLORIDE 0.9 % IV BOLUS (SEPSIS)
1000.0000 mL | Freq: Once | INTRAVENOUS | Status: AC
  Administered 2017-04-14: 1000 mL via INTRAVENOUS

## 2017-04-14 NOTE — Discharge Instructions (Signed)
Return here as needed. Follow up with the Urologist provided. The testing here tonight did not show any significant abnormalities. Will treat for UTI based on your symptoms

## 2017-04-14 NOTE — ED Triage Notes (Signed)
Pt reports hematuria, diarrhea, 8/10 pelvis pain/pressure and right flank pain. Pt denies hx kidney stones. Pt denies fevers at home. Pt A+OX4, speaking in complete sentences, ambulatory to triage.

## 2017-04-14 NOTE — ED Notes (Signed)
I attempted twice and was unsuccessful 

## 2017-04-14 NOTE — ED Provider Notes (Signed)
[facility]-EMERGENCY DEPT Provider Note   CSN: [id number] Arrival date & time: 04/14/17  0048     History   Chief Complaint Chief Complaint  Patient presents with  . Hematuria  . Flank Pain    Right    HPI Emily Carrillo is a 46 y.o. female.  HPI Patient presents to the emergency department with difficulty urinating earlier today.  The patient states that she was having difficulty urinating, states that when she was able to urinate.  She blood in her urine.  Patient states that nothing seems make her condition better or worse.  She states that she did not take any medications prior to arrival.  Patient states that she has never had any kidney stones that she knows of in the past. The patient denies chest pain, shortness of breath, headache,blurred vision, neck pain, fever, cough, weakness, numbness, dizziness, anorexia, edema, abdominal pain, nausea, vomiting, diarrhea, rash, back pain, hematemesis, bloody stool, near syncope, or syncope. Past Medical History:  Diagnosis Date  . Anxiety   . Diabetes mellitus without complication (HCC)   . Migraine   . Renal disorder   . Stroke [facility])     There are no active problems to display for this patient.   Past Surgical History:  Procedure Laterality Date  . ABDOMINAL HYSTERECTOMY    . Bladder tacking      OB History    No data available       Home Medications    Prior to Admission medications   Medication Sig Start Date End Date Taking? Authorizing Provider  acetaminophen (TYLENOL) 500 MG tablet Take 1,000 mg by mouth every 6 (six) hours as needed for headache.   Yes Historical Provider, MD  acetaminophen-codeine (TYLENOL #3) 300-30 MG tablet Take 1 tablet by mouth every 4 (four) hours as needed for moderate pain.   Yes Historical Provider, MD  metFORMIN (GLUCOPHAGE) 500 MG tablet Take 500 mg by mouth 2 (two) times daily with a meal.   Yes Historical Provider, MD  methylphenidate (RITALIN) 20 MG tablet Take 20 mg by mouth 2 (two)  times daily.   Yes Historical Provider, MD  naproxen sodium (ANAPROX) 220 MG tablet Take 220 mg by mouth 2 (two) times daily as needed (headache).   Yes Historical Provider, MD  Oxcarbazepine (TRILEPTAL) 300 MG tablet Take 300 mg by mouth daily.   Yes Historical Provider, MD  telmisartan (MICARDIS) 20 MG tablet Take 20 mg by mouth daily.   Yes Historical Provider, MD  zolpidem (AMBIEN) 10 MG tablet Take 10 mg by mouth at bedtime as needed for sleep.   Yes Historical Provider, MD  azithromycin (ZITHROMAX) 250 MG tablet Take 1 tablet (250 mg total) by mouth daily. Take first 2 tablets together, then 1 every day until finished. Patient not taking: Reported on 10/11/2016 12/08/15   Trixie Dredge, PA-C  benzonatate (TESSALON) 100 MG capsule Take 1 capsule (100 mg total) by mouth 3 (three) times daily as needed for cough. Patient not taking: Reported on 10/11/2016 12/08/15   Trixie Dredge, PA-C  cyclobenzaprine (FLEXERIL) 10 MG tablet Take 1 tablet (10 mg total) by mouth 3 (three) times daily as needed for muscle spasms. Patient not taking: Reported on 10/11/2016 10/26/15   Elpidio Anis, PA-C  famotidine (PEPCID) 20 MG tablet Take 1 tablet (20 mg total) by mouth 2 (two) times daily. Patient not taking: Reported on 10/11/2016 01/25/15   Jaynie Crumble, PA-C  hydrOXYzine (ATARAX/VISTARIL) 25 MG tablet Take 1 tablet (25 mg  total) by mouth every 6 (six) hours. Patient not taking: Reported on 10/11/2016 01/25/15   Jaynie Crumble, PA-C  ibuprofen (ADVIL,MOTRIN) 800 MG tablet Take 1 tablet (800 mg total) by mouth 3 (three) times daily. Patient not taking: Reported on 10/11/2016 10/26/15   Elpidio Anis, PA-C  loratadine (CLARITIN) 10 MG tablet Take 1 tablet (10 mg total) by mouth daily. Patient not taking: Reported on 10/11/2016 10/25/14   Loren Racer, MD  mometasone (NASONEX) 50 MCG/ACT nasal spray Place 2 sprays into the nose daily. Patient not taking: Reported on 10/11/2016 10/25/14   Loren Racer, MD    naproxen (NAPROSYN) 500 MG tablet Take 1 tablet (500 mg total) by mouth 2 (two) times daily with a meal. Patient not taking: Reported on 10/11/2016 07/08/15   Victorino Dike Piepenbrink, PA-C  ondansetron (ZOFRAN ODT) 4 MG disintegrating tablet Take 1 tablet (4 mg total) by mouth every 8 (eight) hours as needed for nausea or vomiting. Patient not taking: Reported on 04/14/2017 01/26/16   Joni Reining Pisciotta, PA-C  oxymetazoline (AFRIN NASAL SPRAY) 0.05 % nasal spray Place 1 spray into both nostrils 2 (two) times daily. Patient not taking: Reported on 10/11/2016 10/25/14   Loren Racer, MD  predniSONE (DELTASONE) 20 MG tablet Take 2 tablets (40 mg total) by mouth daily. Patient not taking: Reported on 10/11/2016 01/25/15   Jaynie Crumble, PA-C    Family History Family History  Problem Relation Age of Onset  . Cancer Mother   . Stroke Father   . Hypertension Father   . Diabetes Father     Social History Social History  Substance Use Topics  . Smoking status: Never Smoker  . Smokeless tobacco: Never Used  . Alcohol use No     Allergies   Imitrex [sumatriptan]; Amoxicillin; and Penicillins   Review of Systems Review of Systems All other systems negative except as documented in the HPI. All pertinent positives and negatives as reviewed in the HPI.  Physical Exam Updated Vital Signs BP 126/80 (BP Location: Right Arm)   Pulse 73   Temp 98.8 F (37.1 C) (Oral)   Resp 18   Ht  (1.626 m)   Wt 96.8 kg   SpO2 94%   BMI 36.63 kg/m   Physical Exam  Constitutional: She is oriented to person, place, and time. She appears well-developed and well-nourished. No distress.  HENT:  Head: Normocephalic and atraumatic.  Mouth/Throat: Oropharynx is clear and moist.  Eyes: Pupils are equal, round, and reactive to light.  Neck: Normal range of motion. Neck supple.  Cardiovascular: Normal rate, regular rhythm and normal heart sounds.  Exam reveals no gallop and no friction rub.   No murmur  heard. Pulmonary/Chest: Effort normal and breath sounds normal. No respiratory distress. She has no wheezes.  Abdominal: Soft. Bowel sounds are normal. She exhibits no distension and no mass. There is tenderness. There is no guarding.    Neurological: She is alert and oriented to person, place, and time. She exhibits normal muscle tone. Coordination normal.  Skin: Skin is warm and dry. Capillary refill takes less than 2 seconds. No rash noted. No erythema.  Psychiatric: She has a normal mood and affect. Her behavior is normal.  Nursing note and vitals reviewed.    ED Treatments / Results  Labs (all labs ordered are listed, but only abnormal results are displayed) Labs Reviewed  URINALYSIS, ROUTINE W REFLEX MICROSCOPIC - Abnormal; Notable for the following:       Result Value   APPearance  TURBID (*)    Glucose, UA 50 (*)    Hgb urine dipstick MODERATE (*)    Protein, ur 100 (*)    Leukocytes, UA TRACE (*)    All other components within normal limits  COMPREHENSIVE METABOLIC PANEL - Abnormal; Notable for the following:    Glucose, Bld 107 (*)    All other components within normal limits  LIPASE, BLOOD  CBC    EKG  EKG Interpretation None       Radiology Ct Renal Stone Study  Result Date: 04/14/2017 CLINICAL DATA:  Right flank pain and hematuria EXAM: CT ABDOMEN AND PELVIS WITHOUT CONTRAST TECHNIQUE: Multidetector CT imaging of the abdomen and pelvis was performed following the standard protocol without IV contrast. COMPARISON:  CT abdomen pelvis 03/16/2013 FINDINGS: Lower chest: No pulmonary nodules or pleural effusion. No visible pericardial effusion. Hepatobiliary: Normal noncontrast appearance of the liver. No visible biliary dilatation. Normal gallbladder. Pancreas: Normal noncontrast appearance of the pancreas. No peripancreatic fluid collection. There is a single calcification just posterior to the pancreatic duct in the pancreatic head, unchanged. Spleen: Normal. Adrenal  glands: Normal. Urinary Tract: --Right kidney/ureter: There is a 1 mm nonobstructive calculus near the right upper pole. The right ureter is nonobstructed. No hydronephrosis. --Left kidney/ureter: No hydronephrosis or perinephric stranding. No nephrolithiasis. No obstructing ureteral stones. --Urinary bladder: Unremarkable. Stomach/Bowel: No dilated loops of bowel. No evidence of colonic or enteric inflammation. No fluid collection within the abdomen. Vascular/Lymphatic: No abdominal aortic aneurysm or atherosclerotic calcification. Numerous pelvic phleboliths. No abdominal or pelvic lymphadenopathy. Reproductive: Status post hysterectomy.  No adnexal lesion. Musculoskeletal. No focal osseous lesion. Normal visualized extraperitoneal and extrathoracic soft tissues. IMPRESSION: 1. No obstructive uropathy. 2. Punctate nonobstructive calculus near the right kidney upper pole. 3. No acute abnormality of the abdomen or pelvis. Electronically Signed   By: Deatra Robinson M.D.   On: 04/14/2017 04:36    Procedures Procedures (including critical care time)  Medications Ordered in ED Medications  ketorolac (TORADOL) 30 MG/ML injection 30 mg (not administered)  sodium chloride 0.9 % bolus 1,000 mL (1,000 mLs Intravenous New Bag/Given 04/14/17 0335)     Initial Impression / Assessment and Plan / ED Course  I have reviewed the triage vital signs and the nursing notes.  Pertinent labs & imaging results that were available during my care of the patient were reviewed by me and considered in my medical decision making (see chart for details).     Patient does not have any signs of any significant abnormality noted on CT scan.  Patient's urinalysis does show hematuria.  The patient follow-up with her primary care doctor.  We will send a culture of her urine to exclude any infectious process.  Patient agrees the plan and all questions were answered  Final Clinical Impressions(s) / ED Diagnoses   Final diagnoses:    None    New Prescriptions New Prescriptions   No medications on file     Charlestine Night, PA-C 04/14/17 0449    Zadie Rhine, MD 04/16/17 0401

## 2017-04-16 LAB — URINE CULTURE: Culture: >=100000 — AB

## 2017-04-17 ENCOUNTER — Telehealth: Payer: Self-pay

## 2017-04-17 NOTE — Telephone Encounter (Signed)
Post ED Visit - Positive Culture Follow-up  Culture report reviewed by antimicrobial stewardship pharmacist:   Enzo Bi, Pharm.D.  Celedonio Miyamoto, Pharm.D., BCPS AQ-ID  Garvin Fila, Pharm.D., BCPS  Georgina Pillion, [address].D., BCPS  [place], [address].D., BCPS, AAHIVP  Estella Husk, Pharm.D., BCPS, AAHIVP  Lysle Pearl, PharmD, BCPS  Casilda Carls, PharmD, BCPS  Pollyann Samples, PharmD, BCPS  Positive urine culture Treated with Nitrofurantoin, organism sensitive to the same and no further patient follow-up is required at this time.  Jerry Caras 04/17/2017, 10:10 AM

## 2017-11-08 IMAGING — CR DG CHEST 2V
2 series · 2 of 2 positions shown · non-contrast
Comparison: 10/26/2015

CLINICAL DATA: Headache and generalized weakness. Chest pain and
palpitations.

EXAM:
CHEST  2 VIEW

[w chest pa]
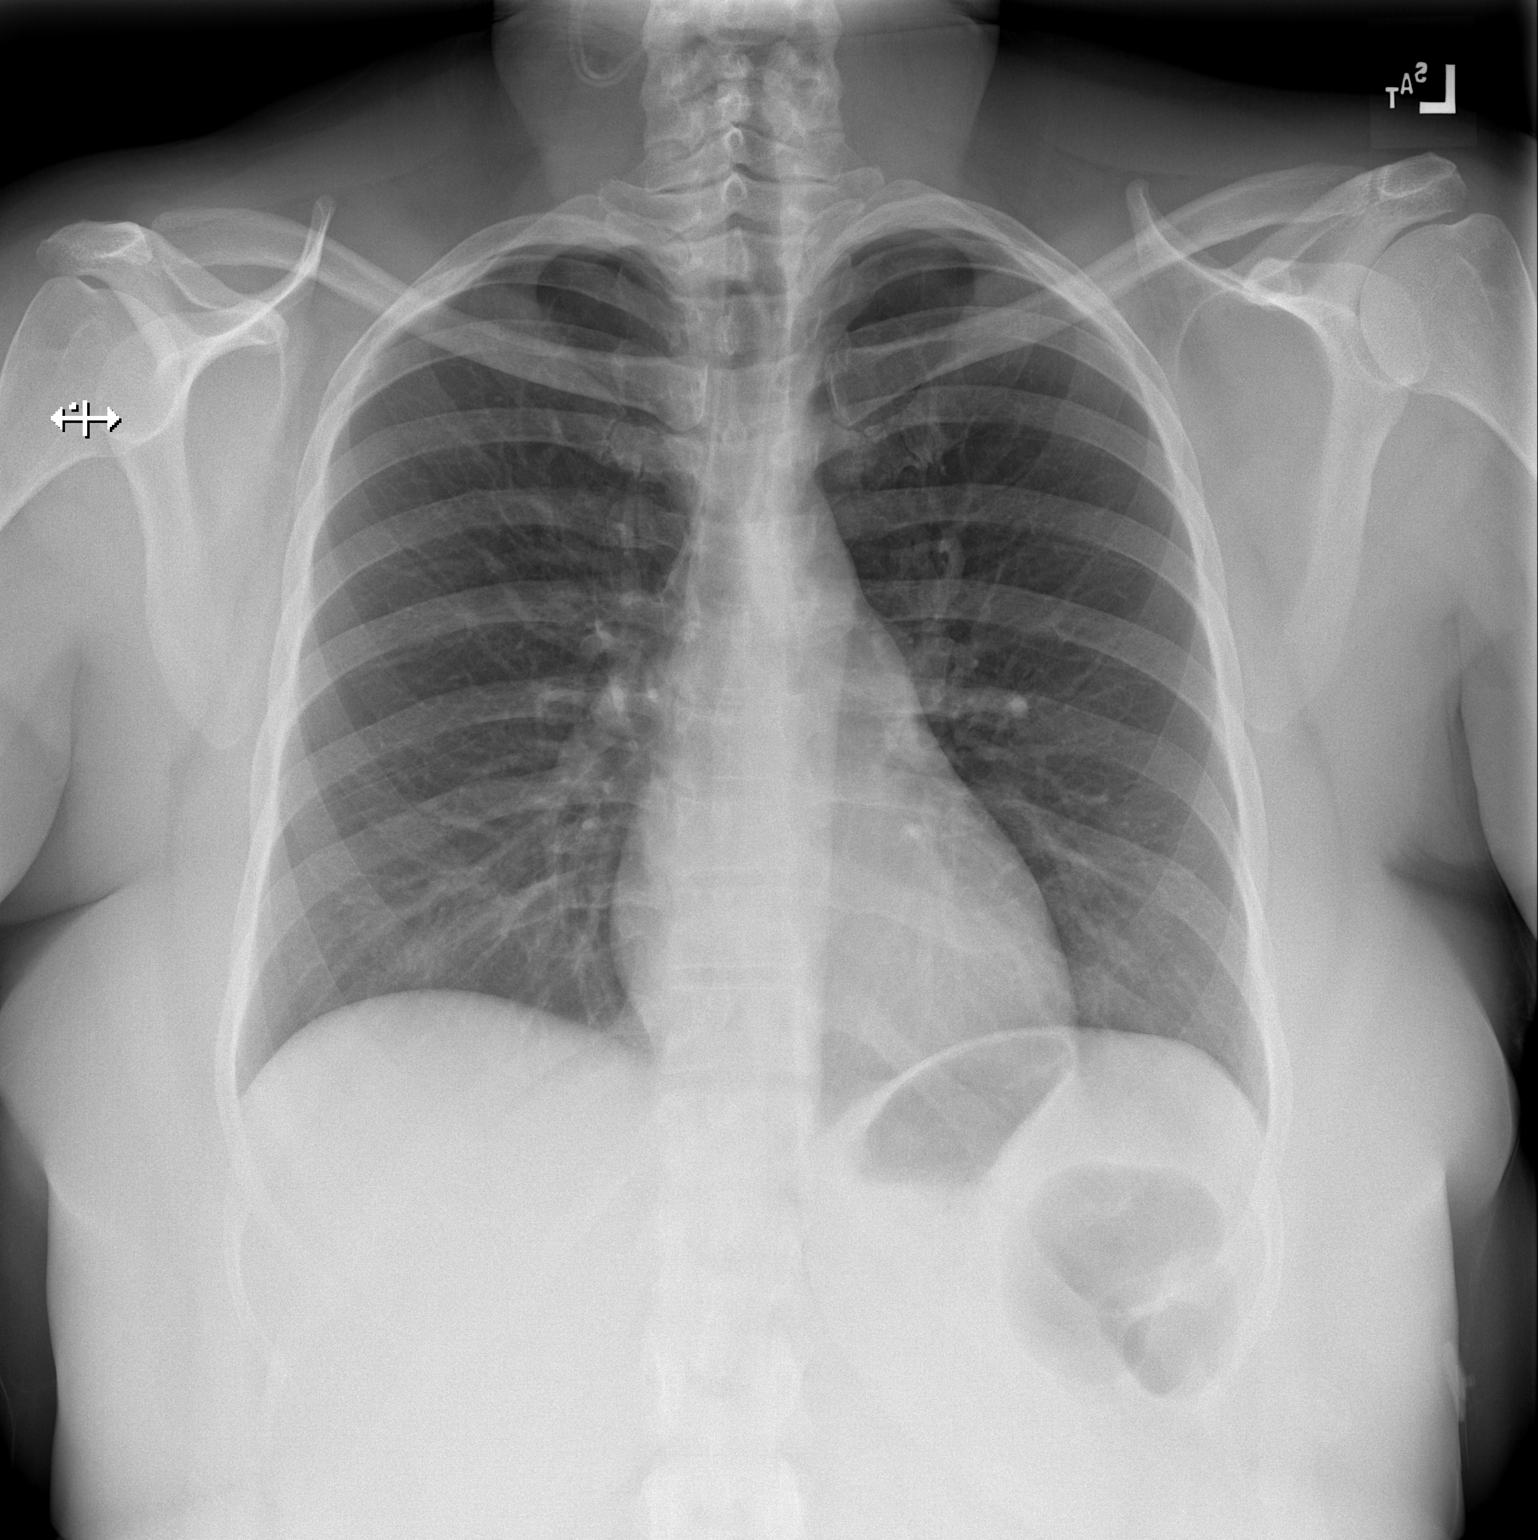

[w chest lat]
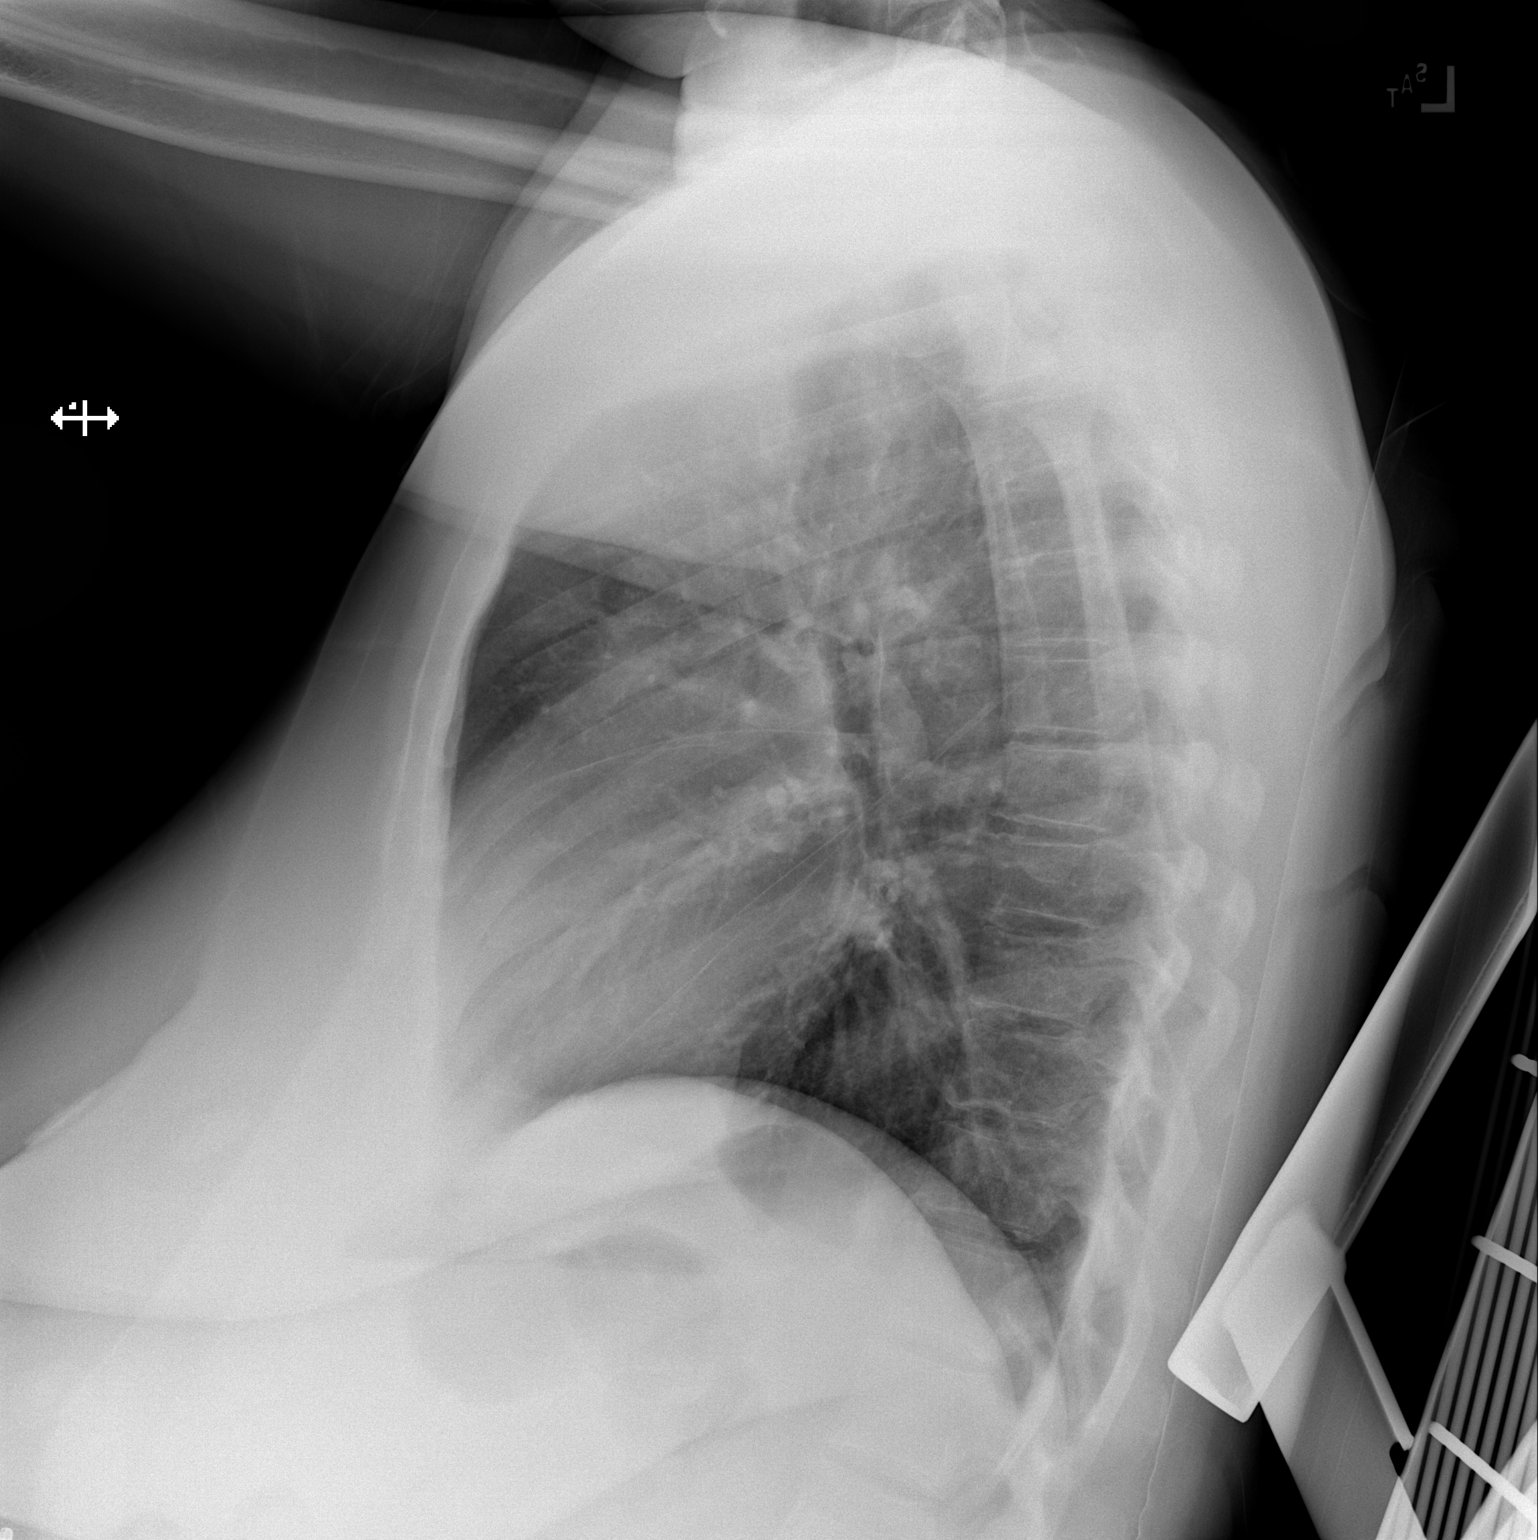

[2 of 2 positions shown; findings below may reference images not displayed]

FINDINGS: The heart size and mediastinal contours are within normal limits.
Both lungs are clear. The visualized skeletal structures are
unremarkable.
IMPRESSION: No active cardiopulmonary disease.

## 2017-11-09 IMAGING — RF DG FLUORO GUIDE LUMBAR PUNCTURE
1 series · 1 of 1 positions shown · non-contrast
Comparison: none

CLINICAL DATA: Headache.

[Series 1: run · 1 of 1 slices shown]
[im 1/1]
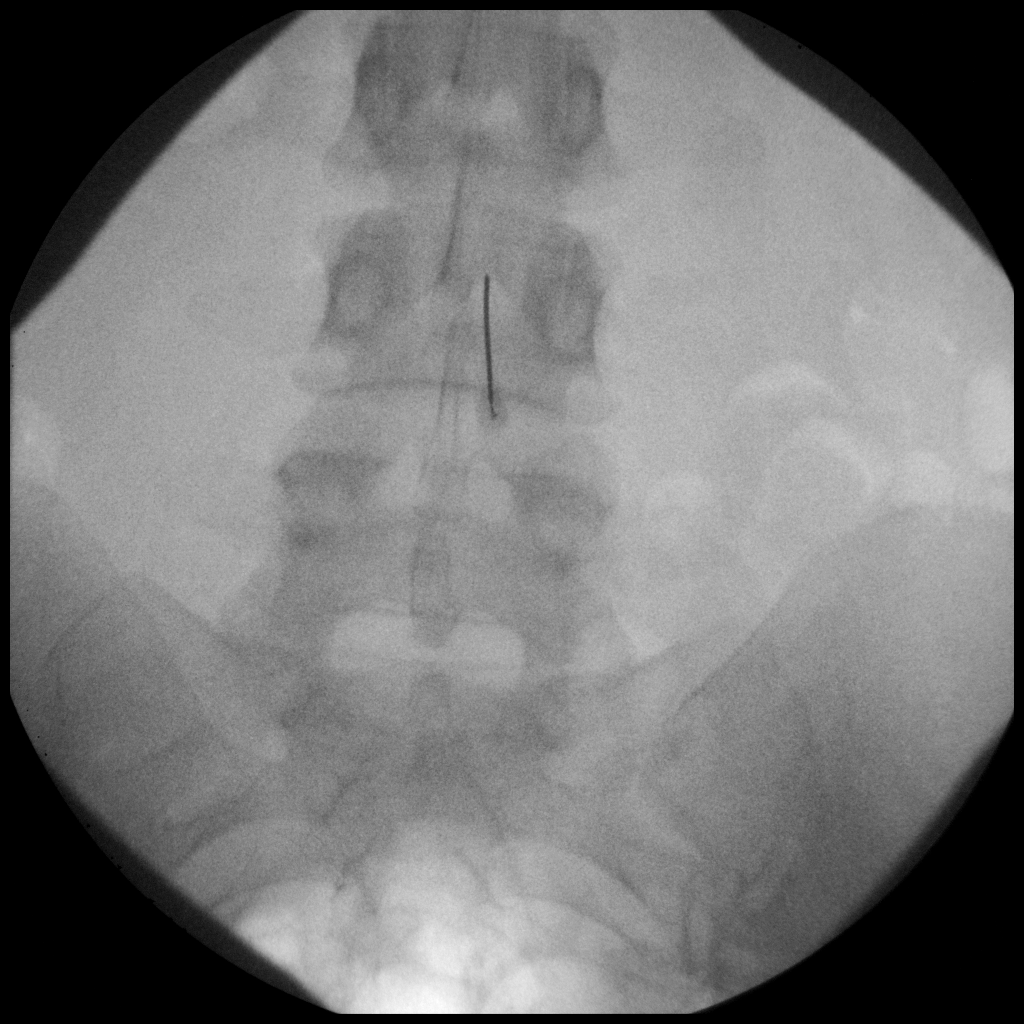

[1 of 1 positions shown; findings below may reference images not displayed]

EXAM:
DIAGNOSTIC LUMBAR PUNCTURE UNDER FLUOROSCOPIC GUIDANCE

FLUOROSCOPY TIME:  6 seconds.

PROCEDURE:
Informed consent was obtained from the patient prior to the
procedure, including potential complications of headache, allergy,
and pain. With the patient prone, the lower back was prepped with
Betadine. 1% Lidocaine was used for local anesthesia. Lumbar
puncture was performed at the L3-L4 level using a 20 gauge needle
with return of clear CSF with an opening pressure of 25 cm water.
10.0 ml of CSF were obtained for laboratory studies. The patient
tolerated the procedure well and there were no apparent
complications.
IMPRESSION: Technically successful fluoroscopically guided lumbar puncture with
clear colorless CSF. Mildly elevated opening pressure at 25 cm
water.

## 2018-05-12 IMAGING — CT CT RENAL STONE PROTOCOL
2 of 3 series · 16 of 41 positions shown, 18 images · non-contrast
Comparison: CT abdomen pelvis 03/16/2013

CLINICAL DATA: Right flank pain and hematuria

EXAM:
CT ABDOMEN AND PELVIS WITHOUT CONTRAST
TECHNIQUE: Multidetector CT imaging of the abdomen and pelvis was performed
following the standard protocol without IV contrast.

[Series 3: lung · axial · 0.74mm/px · z∈[+155,+203]mm · 13 of 27 slices shown, 15 images]
[im 2/27  soft-tissue]
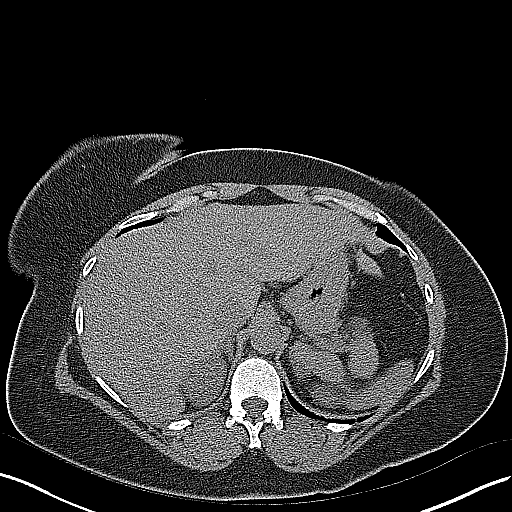
[im 2/27  bone]
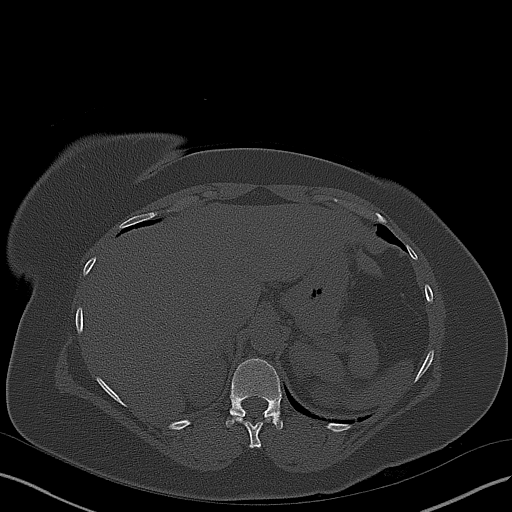
[im 4/27  soft-tissue]
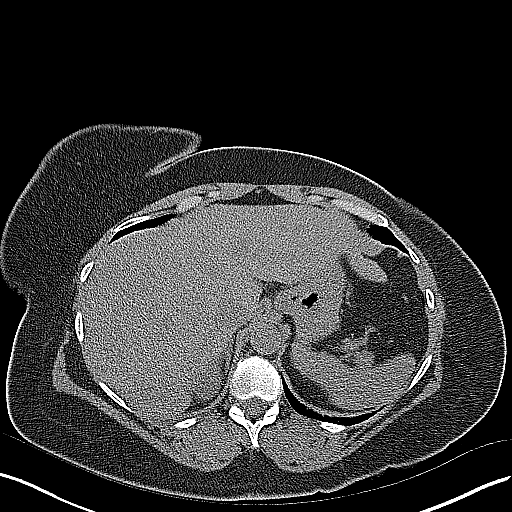
[im 6/27  soft-tissue]
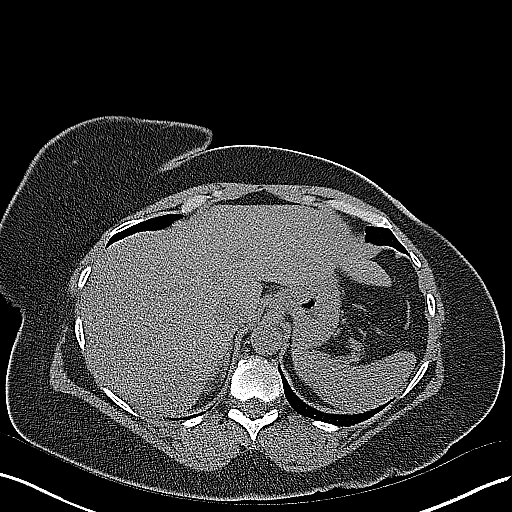
[im 8/27  soft-tissue]
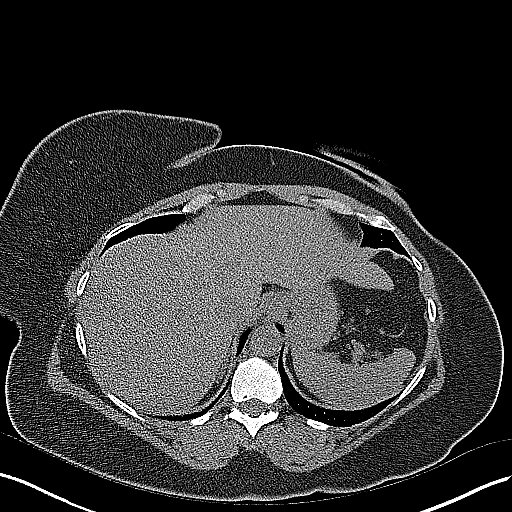
[im 10/27  soft-tissue]
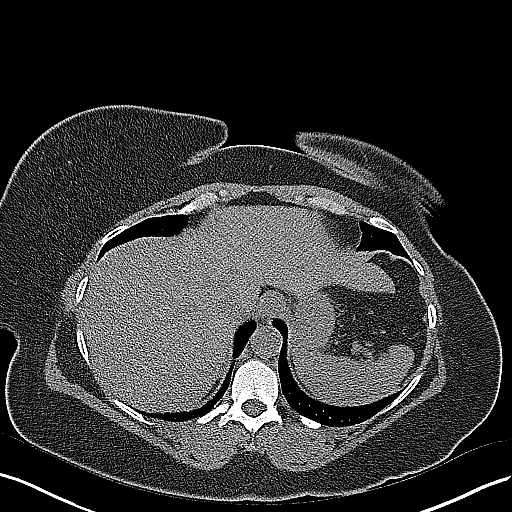
[im 12/27  soft-tissue]
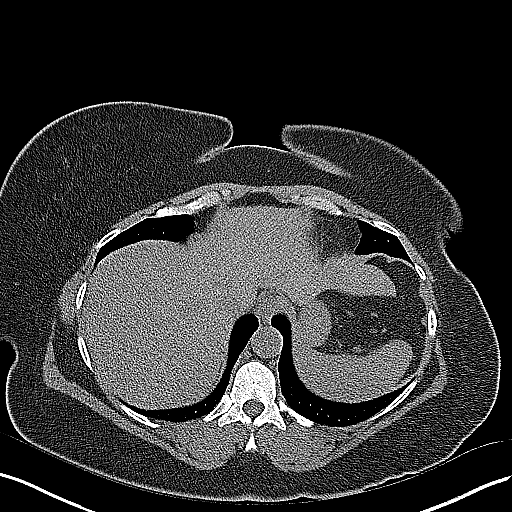
[im 14/27  soft-tissue]
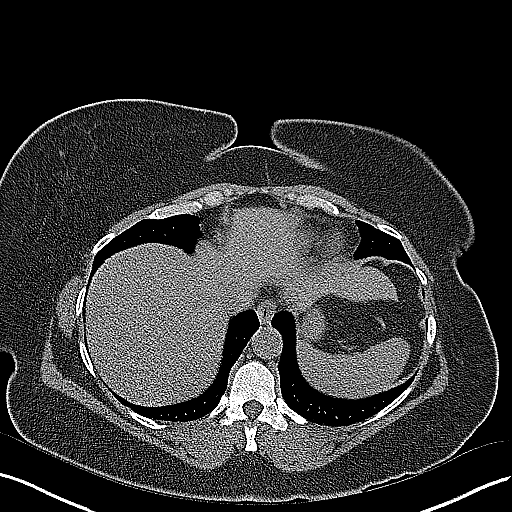
[im 16/27  soft-tissue]
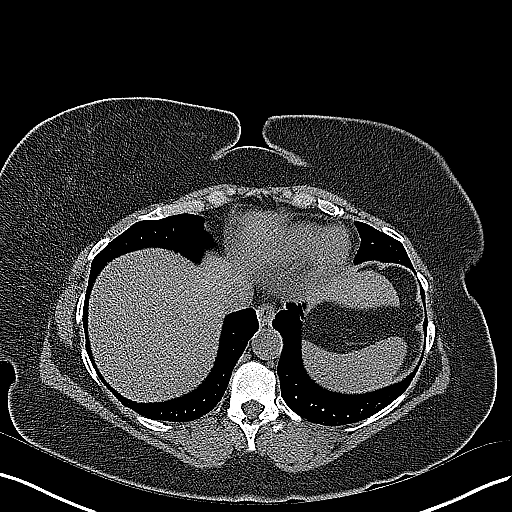
[im 18/27  soft-tissue]
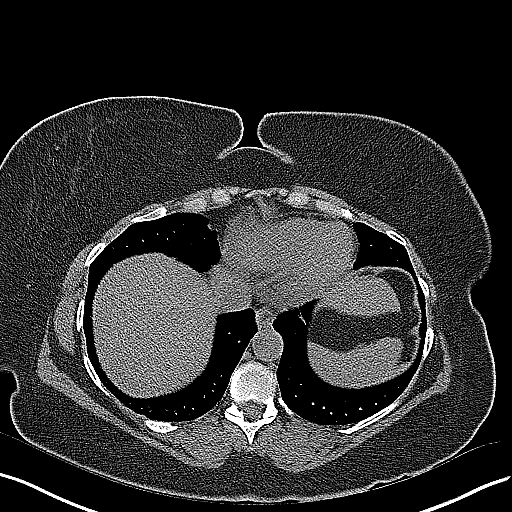
[im 18/27  bone]
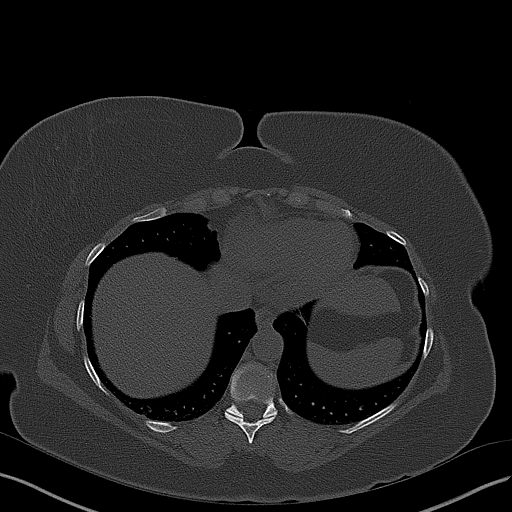
[im 20/27  soft-tissue]
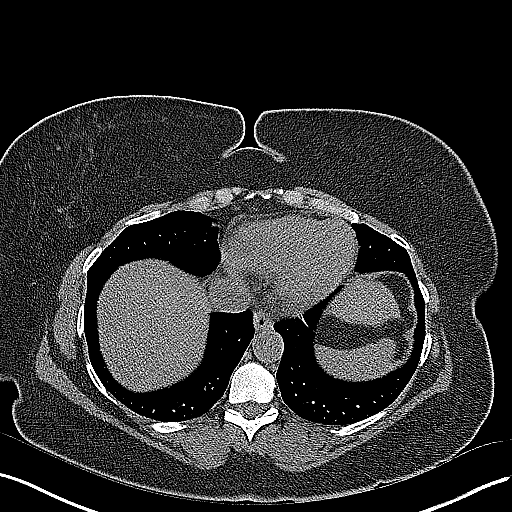
[im 22/27  soft-tissue]
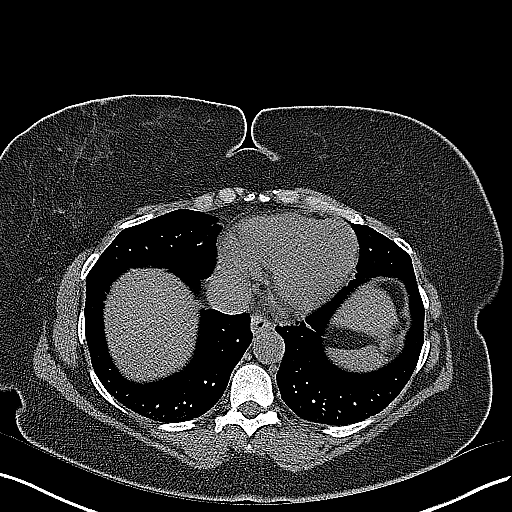
[im 24/27  soft-tissue]
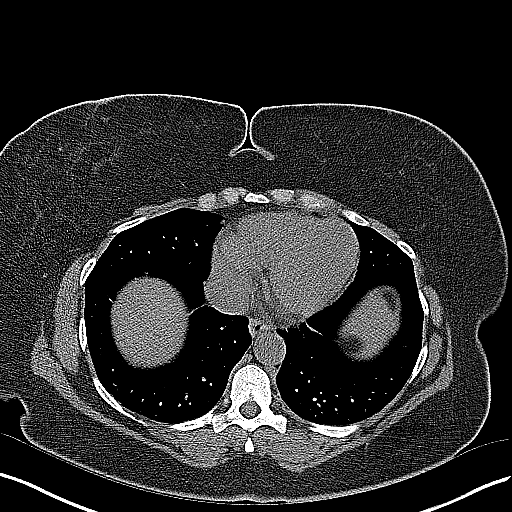
[im 26/27  soft-tissue]
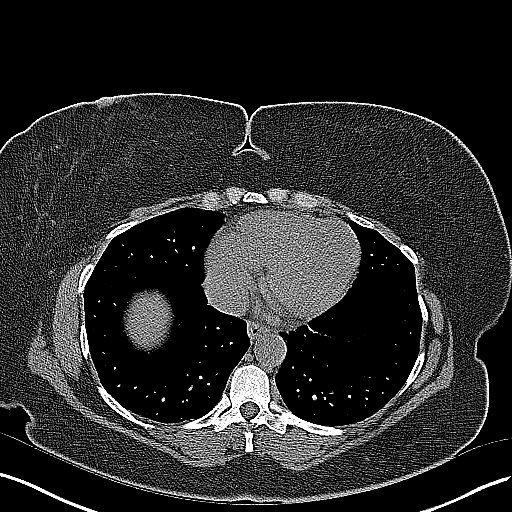

[Series 4: coronal · coronal · 0.70mm/px · 3 of 122 slices shown]
[im 41/122  soft-tissue]
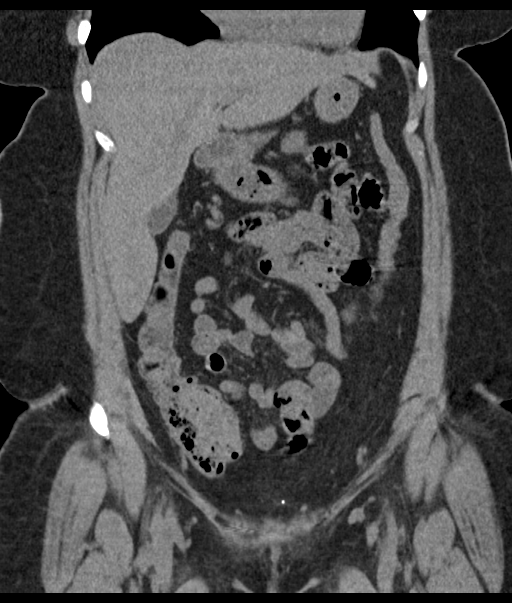
[im 54/122  soft-tissue]
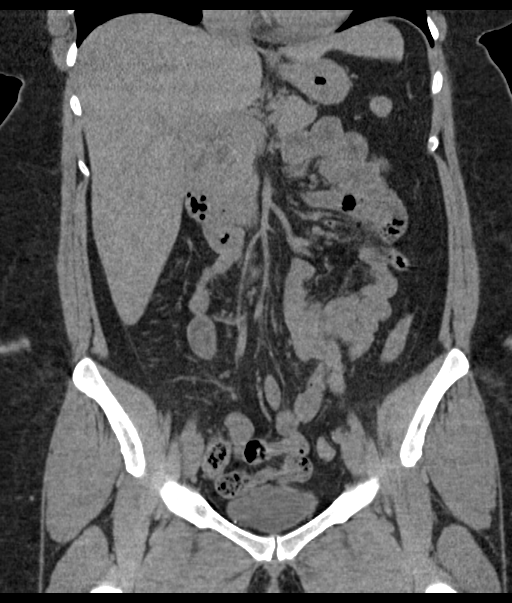
[im 68/122  soft-tissue]
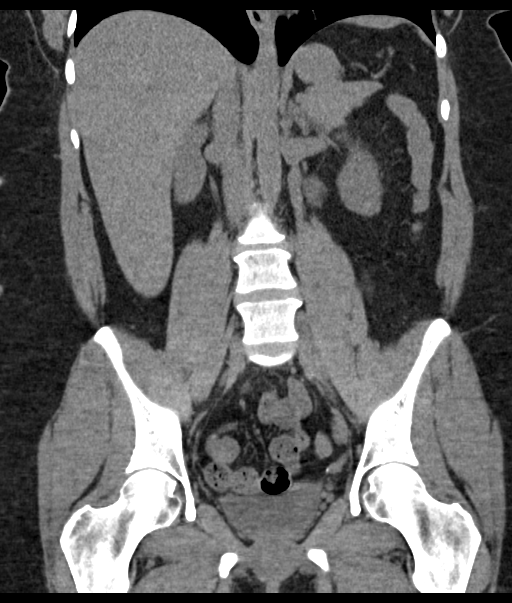

[16 of 41 positions shown; findings below may reference images not displayed]

FINDINGS: Lower chest: No pulmonary nodules or pleural effusion. No visible
pericardial effusion.

Hepatobiliary: Normal noncontrast appearance of the liver. No
visible biliary dilatation. Normal gallbladder.

Pancreas: Normal noncontrast appearance of the pancreas. No
peripancreatic fluid collection. There is a single calcification
just posterior to the pancreatic duct in the pancreatic head,
unchanged.

Spleen: Normal.

Adrenal glands: Normal.

Urinary Tract:

--Right kidney/ureter: There is a 1 mm nonobstructive calculus near
the right upper pole. The right ureter is nonobstructed. No
hydronephrosis.

--Left kidney/ureter: No hydronephrosis or perinephric stranding. No
nephrolithiasis. No obstructing ureteral stones.

--Urinary bladder: Unremarkable.

Stomach/Bowel: No dilated loops of bowel. No evidence of colonic or
enteric inflammation. No fluid collection within the abdomen.

Vascular/Lymphatic: No abdominal aortic aneurysm or atherosclerotic
calcification. Numerous pelvic phleboliths. No abdominal or pelvic
lymphadenopathy.

Reproductive: Status post hysterectomy.  No adnexal lesion.

Musculoskeletal. No focal osseous lesion. Normal visualized
extraperitoneal and extrathoracic soft tissues.
IMPRESSION: 1. No obstructive uropathy.
2. Punctate nonobstructive calculus near the right kidney upper
pole.
3. No acute abnormality of the abdomen or pelvis.

## 2020-02-15 ENCOUNTER — Other Ambulatory Visit: Payer: Self-pay | Admitting: Urology

## 2020-03-11 NOTE — Patient Instructions (Addendum)
DUE TO COVID-19 ONLY TWO VISITOR IS ALLOWED TO COME WITH YOU AND STAY IN THE WAITING ROOM ONLY DURING PRE OP AND PROCEDURE DAY OF SURGERY. THE 2 VISITORS MAY VISIT WITH YOU AFTER SURGERY IN YOUR PRIVATE ROOM DURING VISITING HOURS ONLY!  YOU NEED TO HAVE A COVID 19 TEST ON:3?26/21 @  1:00 pm    , THIS TEST MUST BE DONE BEFORE SURGERY, COME  [address], [place] , [address].  [facility]) ONCE YOUR COVID TEST IS COMPLETED, PLEASE BEGIN THE QUARANTINE INSTRUCTIONS AS OUTLINED IN YOUR HANDOUT.                Emily Carrillo    Your procedure is scheduled on:03/19/20    Report to [facility] Main  Entrance   Report to SHORT STAY at: 5:30 AM     Call this number if you have problems the morning of surgery [phone]    Remember: Do not eat food or drink liquids :After Midnight.   BRUSH YOUR TEETH MORNING OF SURGERY AND RINSE YOUR MOUTH OUT, NO CHEWING GUM CANDY OR MINTS.  How to Manage Your Diabetes Before and After Surgery  Why is it important to control my blood sugar before and after surgery? . Improving blood sugar levels before and after surgery helps healing and can limit problems. . A way of improving blood sugar control is eating a healthy diet by: o  Eating less sugar and carbohydrates o  Increasing activity/exercise o  Talking with your doctor about reaching your blood sugar goals . High blood sugars (greater than 180 mg/dL) can raise your risk of infections and slow your recovery, so you will need to focus on controlling your diabetes during the weeks before surgery. . Make sure that the doctor who takes care of your diabetes knows about your planned surgery including the date and location.  How do I manage my blood sugar before surgery? . Check your blood sugar at least 4 times a day, starting 2 days before surgery, to make sure that the level is not too high or low. o Check your blood sugar the morning of your surgery when you wake up and  every 2 hours until you get to the Short Stay unit. . If your blood sugar is less than 70 mg/dL, you will need to treat for low blood sugar: o Do not take insulin. o Treat a low blood sugar (less than 70 mg/dL) with  cup of clear juice (cranberry or apple), 4 glucose tablets, OR glucose gel. o Recheck blood sugar in 15 minutes after treatment (to make sure it is greater than 70 mg/dL). If your blood sugar is not greater than 70 mg/dL on recheck, call [phone] for further instructions. . Report your blood sugar to the short stay nurse when you get to Short Stay.  . If you are admitted to the hospital after surgery: o Your blood sugar will be checked by the staff and you will probably be given insulin after surgery (instead of oral diabetes medicines) to make sure you have good blood sugar levels. o The goal for blood sugar control after surgery is 80-180 mg/dL.   WHAT DO I DO ABOUT MY DIABETES MEDICATION?  Marland Kitchen Do not take oral diabetes medicines (pills) the morning of surgery.  . THE DAY BEFORE SURGERY, take METFORMIN as usual        . THE MORNING OF SURGERY, Do not take METFORMIN.  Take these medicines the morning  of surgery with A SIP OF WATER: RITALIN,TRILEPTA(OXCARBAZEPINE),NITROFURANTOIN(MACROCRYSTAL)              You may not have any metal on your body including hair pins and              piercings  Do not wear jewelry, make-up, lotions, powders or perfumes, deodorant             Do not wear nail polish on your fingernails.  Do not shave  48 hours prior to surgery.               Do not bring valuables to the hospital. [facility] IS NOT             RESPONSIBLE   FOR VALUABLES.  Contacts, dentures or bridgework may not be worn into surgery.  Leave suitcase in the car. After surgery it may be brought to your room.     Patients discharged the day of surgery will not be allowed to drive home. IF YOU ARE HAVING SURGERY AND GOING HOME THE SAME DAY, YOU MUST HAVE AN ADULT TO DRIVE  YOU HOME AND BE WITH YOU FOR 24 HOURS. YOU MAY GO HOME BY TAXI OR UBER OR ORTHERWISE, BUT AN ADULT MUST ACCOMPANY YOU HOME AND STAY WITH YOU FOR 24 HOURS.  Name and phone number of your driver:  Special Instructions: N/A              Please read over the following fact sheets you were given: _____________________________________________________________________             [facility] - Preparing for Surgery Before surgery, you can play an important role.  Because skin is not sterile, your skin needs to be as free of germs as possible.  You can reduce the number of germs on your skin by washing with CHG (chlorahexidine gluconate) soap before surgery.  CHG is an antiseptic cleaner which kills germs and bonds with the skin to continue killing germs even after washing. Please DO NOT use if you have an allergy to CHG or antibacterial soaps.  If your skin becomes reddened/irritated stop using the CHG and inform your nurse when you arrive at Short Stay. Do not shave (including legs and underarms) for at least 48 hours prior to the first CHG shower.  You may shave your face/neck. Please follow these instructions carefully:  1.  Shower with CHG Soap the night before surgery and the  morning of Surgery.  2.  If you choose to wash your hair, wash your hair first as usual with your  normal  shampoo.  3.  After you shampoo, rinse your hair and body thoroughly to remove the  shampoo.                           4.  Use CHG as you would any other liquid soap.  You can apply chg directly  to the skin and wash                       Gently with a scrungie or clean washcloth.  5.  Apply the CHG Soap to your body ONLY FROM THE NECK DOWN.   Do not use on face/ open                           Wound or open sores. Avoid contact with eyes,  ears mouth and genitals (private parts).                       Wash face,  Genitals (private parts) with your normal soap.             6.  Wash thoroughly, paying special attention to  the area where your surgery  will be performed.  7.  Thoroughly rinse your body with warm water from the neck down.  8.  DO NOT shower/wash with your normal soap after using and rinsing off  the CHG Soap.                9.  Pat yourself dry with a clean towel.            10.  Wear clean pajamas.            11.  Place clean sheets on your bed the night of your first shower and do not  sleep with pets. Day of Surgery : Do not apply any lotions/deodorants the morning of surgery.  Please wear clean clothes to the hospital/surgery center.  FAILURE TO FOLLOW THESE INSTRUCTIONS MAY RESULT IN THE CANCELLATION OF YOUR SURGERY PATIENT SIGNATURE_________________________________  NURSE SIGNATURE__________________________________  ________________________________________________________________________

## 2020-03-12 ENCOUNTER — Encounter (HOSPITAL_COMMUNITY): Payer: Self-pay

## 2020-03-12 ENCOUNTER — Encounter (HOSPITAL_COMMUNITY)
Admission: RE | Admit: 2020-03-12 | Discharge: 2020-03-12 | Disposition: A | Discharge status: Home / Self Care | Payer: BC Managed Care – PPO | Source: Ambulatory Visit | Attending: Urology | Admitting: Urology

## 2020-03-12 ENCOUNTER — Other Ambulatory Visit: Payer: Self-pay

## 2020-03-12 DIAGNOSIS — Z01818 Encounter for other preprocedural examination: Secondary | ICD-10-CM | POA: Insufficient documentation

## 2020-03-12 HISTORY — DX: Essential (primary) hypertension: I10

## 2020-03-12 LAB — CBC
HCT: 38.6 % (ref 36.0–46.0)
Hemoglobin: 12.5 g/dL (ref 12.0–15.0)
MCH: 30 pg (ref 26.0–34.0)
MCHC: 32.4 g/dL (ref 30.0–36.0)
MCV: 92.8 fL (ref 80.0–100.0)
Platelets: 312 10*3/uL (ref 150–400)
RBC: 4.16 MIL/uL (ref 3.87–5.11)
RDW: 13.9 % (ref 11.5–15.5)
WBC: 4.4 10*3/uL (ref 4.0–10.5)
nRBC: 0 % (ref 0.0–0.2)

## 2020-03-12 LAB — HEMOGLOBIN A1C
Hgb A1c MFr Bld: 5.8 % — ABNORMAL HIGH (ref 4.8–5.6)
Mean Plasma Glucose: 119.76 mg/dL

## 2020-03-12 LAB — BASIC METABOLIC PANEL
Anion gap: 9 (ref 5–15)
BUN: 14 mg/dL (ref 6–20)
CO2: 28 mmol/L (ref 22–32)
Calcium: 9 mg/dL (ref 8.9–10.3)
Chloride: 103 mmol/L (ref 98–111)
Creatinine, Ser: 0.63 mg/dL (ref 0.44–1.00)
GFR calc Af Amer: >60 mL/min (ref >60)
GFR calc non Af Amer: >60 mL/min (ref >60)
Glucose, Bld: 90 mg/dL (ref 70–99)
Potassium: 4 mmol/L (ref 3.5–5.1)
Sodium: 140 mmol/L (ref 135–145)

## 2020-03-12 LAB — PROTIME-INR
INR: 0.9 (ref 0.8–1.2)
Prothrombin Time: 12.3 seconds (ref 11.4–15.2)

## 2020-03-12 LAB — GLUCOSE, CAPILLARY: Glucose-Capillary: 88 mg/dL (ref 70–99)

## 2020-03-12 LAB — ABO/RH: ABO/RH(D): O POS

## 2020-03-12 NOTE — Progress Notes (Signed)
PCP -  Cardiologist -   Chest x-ray -  EKG -  Stress Test -  ECHO -  Cardiac Cath -   Sleep Study -  CPAP -   Fasting Blood Sugar - 110 Checks Blood Sugar 1  times a week  Blood Thinner Instructions: Aspirin Instructions: Last Dose:  Anesthesia review:   Patient denies shortness of breath, fever, cough and chest pain at PAT appointment   Patient verbalized understanding of instructions that were given to them at the PAT appointment. Patient was also instructed that they will need to review over the PAT instructions again at home before surgery.

## 2020-03-14 NOTE — H&P (Signed)
Today  Patient is a 5 month history of vaginal bulging. Sometimes she reduce it. She has had a hysterectomy.   She leaks with coughing sneezing and may be bending lifting. She leaks a small amount and does reflex contraction try not leak. She has urge incontinence and no bedwetting. She wears 1 liner a day. She voids every 30-60 minutes and cannot hold it for 2 hours. She gets up 3-4 times a night. I saw her in 2018 for a bladder infection and she has not had many recently.   She had bladder surgery in 2006   On physical examination patient had a large grade 2 cystocele with central defect. The vaginal cuff did not appear to descend but it may have descended from 8 or 9 cm to approximately 6 cm. She had no stress incontinence. No rectocele.   Patient has mixed incontinence. She has a very frequent bladder. I will send urine for culture. She has had a bladder infection history. Picture was drawn if the patient ever had surgery she would likely best benefit from transvaginal cystocele repair and consented for graft and vault suspension.   Patient would like to proceed likely with surgery. When I go over the urodynamics I will perform cystoscopy. I will look to see if the cystocele is a larger when she is sitting in the fluoroscopy chair. I think cystoscopy is warranted because she has had a previous sling or bladder neck suspension   Today  Incontinence stable. Frequency stable. The examination is basically the same. She has a relatively fixed bladder neck and no stress incontinence after cystoscopy. The cystocele would hinge down around the urethra. She does have reasonably good support of the cuff though I am sure it descends noted above a few cm. She has mild narrowing of the vaginal vault and she was warned if she has surgery it may be more challenging. I do believe it could be repaired vaginally.  Distant urine culture Streptococcus. Urinalysis negative but sent for culture  Cystoscopy: Normal   During urodynamics she did not void and was catheterized for 75 mL. Maximum bladder capacity was 469 mL. Low-pressure overactivity reaching pressure of 7 cm water and she did not leak. She had no stress incontinence with or without the prolapse reduced with a pressure of 96 cm of water. She had difficulty voiding in the lab setting generating a low-pressure contraction unable to void. She then voided 370 mL with a maximum flow of 8 mL/second. Maximum voiding pressure of 57 cm water. It was prolonged contraction. It was a long interrupted flow. EMG activity increased during the voiding phase. Fluoroscopically moderately large cystocele with minimal bladder neck descensus. The details of the urodynamics or sign dictated   Picture was drawn. I went over transvaginal vault suspension with cystocele repair and graft. She understands the potential challenging nature of the surgery with her moderately good support of the cuff and anatomy. Based upon her physical examination urodynamics and the fact that she is currently obstructed I do not recommend a 2nd sling. I did not recommend and injectable. The risk of ongoing or worsening stress incontinence discussed with future therapeutic sequelae discussed. This included future sling or office based bulking agent she also has very mild incontinence and has significant overactive bladder and nocturia. She understands the concept treating her incontinence with medication initially versus prolapse surgical goals      ALLERGIES: Imitrex Penicillins - Itching, Hives    MEDICATIONS: Macrobid  Metformin Hcl  Hydrocodone-Acetaminophen  5 mg-325 mg tablet  Ritalin  Telmisartan     GU PSH: Complex cystometrogram, w/ void pressure and urethral pressure profile studies, any technique - 01/17/2020 Complex Uroflow - 01/17/2020 Emg surf Electrd - 01/17/2020 Hysterectomy Unilat SO - 2008 Inject For cystogram - 01/17/2020 Intrabd voidng Press - 01/17/2020       PSH Notes:  Bladder Surgery   NON-GU PSH: None   GU PMH: Cystocele, midline - 12/08/2019 Mixed incontinence - 12/08/2019 Hemorrhagic cystitis - 2018 Nocturia - 2018 Urinary Frequency - 2018 History of urolithiasis, Nephrolithiasis - 2014      PMH Notes:  1898-12-21 00:00:00 - Note: Normal Routine History And Physical Adult   NON-GU PMH: Anxiety, Anxiety - 2014 Personal history of other diseases of the digestive system, History of esophageal reflux - 2014 Personal history of other mental and behavioral disorders, History of depression - 2014 Depression Diabetes Type 2    FAMILY HISTORY: Blood In Urine - Father Breast Cancer - Mother Cancer - Mother, Runs in Family Depression - Mother Diabetes - Father, Mother Family Health Status Number - Runs In Family High Blood Pressure - Grandfather, Brother, Father Kidney Stones - Mother   SOCIAL HISTORY: Marital Status: Divorced Preferred Language: English; Ethnicity: Not Hispanic Or Latino; Race: Black or African American Current Smoking Status: Patient has never smoked.   Tobacco Use Assessment Completed: Used Tobacco in last 30 days? Drinks 3 drinks per year. Types of alcohol consumed: Wine.     REVIEW OF SYSTEMS:    GU Review Female:   Patient reports frequent urination, get up at night to urinate, and leakage of urine. Patient denies hard to postpone urination, burning /pain with urination, stream starts and stops, trouble starting your stream, have to strain to urinate, and being pregnant.  Gastrointestinal (Upper):   Patient denies nausea, vomiting, and indigestion/ heartburn.  Gastrointestinal (Lower):   Patient denies diarrhea and constipation.  Constitutional:   Patient denies fever, night sweats, weight loss, and fatigue.  Skin:   Patient denies skin rash/ lesion and itching.  Eyes:   Patient denies blurred vision and double vision.  Ears/ Nose/ Throat:   Patient denies sore throat and sinus problems.  Hematologic/Lymphatic:    Patient denies swollen glands and easy bruising.  Cardiovascular:   Patient denies leg swelling and chest pains.  Respiratory:   Patient denies cough and shortness of breath.  Endocrine:   Patient denies excessive thirst.  Musculoskeletal:   Patient denies back pain and joint pain.  Neurological:   Patient denies headaches and dizziness.  Psychologic:   Patient denies anxiety and depression.   VITAL SIGNS:      02/01/2020 10:54 AM  Weight 159.3 lb / 72.26 kg  Height 64 in / 162.56 cm  BP 137/87 mmHg  Pulse 94 /min  Temperature 97.3 F / 36.2 C  BMI 27.3 kg/m   PAST DATA REVIEWED:  Source Of History:  Patient   PROCEDURES:         Flexible Cystoscopy - 52000  Risks, benefits, and some of the potential complications of the procedure were discussed at length with the patient including infection, bleeding, voiding discomfort, urinary retention, fever, chills, sepsis, and others. All questions were answered. Informed consent was obtained. Sterile technique and intraurethral analgesia were used.  Ureteral Orifices:  Normal location. Normal size. Normal shape. Effluxed clear urine.  Bladder:  No trabeculation. No tumors. Normal mucosa. No stones.      The lower urinary tract was carefully examined. There  was no stitch, foreign, body, or carcinoma. The procedure was well-tolerated and without complications. Antibiotic instructions were given. Instructions were given to call the office immediately for bloody urine, difficulty urinating, urinary retention, painful or frequent urination, fever, chills, nausea, vomiting or other illness. The patient stated that she understood these instructions and would comply with them.         Urinalysis Dipstick Dipstick Cont'd  Color: Yellow Bilirubin: Neg mg/dL  Appearance: Clear Ketones: Neg mg/dL  Specific Gravity: 1.025 Blood: Neg ery/uL  pH: 6.0 Protein: Neg mg/dL  Glucose: Neg mg/dL Urobilinogen: 0.2 mg/dL    Nitrites: Neg    Leukocyte Esterase:  Neg leu/uL    ASSESSMENT:      ICD-10 Details  1 GU:   Cystocele, midline - N81.11   2   Mixed incontinence - N39.46               Notes:   I drew her a picture and we talked about prolapse surgery in detail. Pros, cons, general surgical and anesthetic risks, and other options including behavioral therapy, pessaries, and watchful waiting were discussed. She understands that prolapse repairs are successful in 80-85% of cases for prolapse symptoms and can recur anteriorly, posteriorly, and/or apically. She understands that in most cases I use a graft and general risks were discussed. Surgical risks were described but not limited to the discussion of injury to neighboring structures including the bowel (with possible life-threatening sepsis and colostomy), bladder, urethra, vagina (all resulting in further surgery), and ureter (resulting in re-implantation). We talked about injury to nerves/soft tissue leading to debilitating and intractable pelvic, abdominal, and lower extremity pain syndromes and neuropathies. The risks of buttock pain, intractable dyspareunia, and vaginal narrowing and shortening with sequelae were discussed. Bleeding risks, transfusion rates, and infection were discussed. The risk of persistent, de novo, or worsening bladder and/or bowel incontinence/dysfunction was discussed. The need for CIC was described as well the usual post-operative course. The patient understands that she might not reach her treatment goal and that she might be worse following surgery.   After a thorough review of the management options for the patient's condition the patient  elected to proceed with surgical therapy as noted above. We have discussed the potential benefits and risks of the procedure, side effects of the proposed treatment, the likelihood of the patient achieving the goals of the procedure, and any potential problems that might occur during the procedure or recuperation. Informed consent has been  obtained.

## 2020-03-15 ENCOUNTER — Other Ambulatory Visit (HOSPITAL_COMMUNITY)
Admission: RE | Admit: 2020-03-15 | Discharge: 2020-03-15 | Disposition: A | Discharge status: Home / Self Care | Payer: BC Managed Care – PPO | Source: Ambulatory Visit | Attending: Urology | Admitting: Urology

## 2020-03-15 ENCOUNTER — Other Ambulatory Visit (HOSPITAL_COMMUNITY): Payer: BC Managed Care – PPO

## 2020-03-15 DIAGNOSIS — Z20822 Contact with and (suspected) exposure to covid-19: Secondary | ICD-10-CM | POA: Diagnosis not present

## 2020-03-15 DIAGNOSIS — Z01812 Encounter for preprocedural laboratory examination: Secondary | ICD-10-CM | POA: Insufficient documentation

## 2020-03-15 LAB — SARS CORONAVIRUS 2 (TAT 6-24 HRS): SARS Coronavirus 2: NEGATIVE

## 2020-03-18 MED ORDER — GENTAMICIN SULFATE 40 MG/ML IJ SOLN
5.0000 mg/kg | INTRAVENOUS | Status: AC
  Administered 2020-03-19: 310 mg via INTRAVENOUS
  Filled 2020-03-18: qty 7.75

## 2020-03-18 NOTE — Anesthesia Preprocedure Evaluation (Addendum)
Anesthesia Evaluation  Patient identified by MRN, date of birth, ID band Patient awake    Reviewed: Allergy & Precautions, NPO status , Patient's Chart, lab work & pertinent test results  Airway Mallampati: II  TM Distance: >3 FB Neck ROM: Full    Dental no notable dental hx. (+) Teeth Intact, Dental Advisory Given   Pulmonary neg pulmonary ROS,    Pulmonary exam normal breath sounds clear to auscultation       Cardiovascular hypertension, Pt. on medications Normal cardiovascular exam Rhythm:Regular Rate:Normal     Neuro/Psych  Headaches, Anxiety CVA, No Residual Symptoms    GI/Hepatic negative GI ROS, Neg liver ROS,   Endo/Other  diabetes, Type 2, Oral Hypoglycemic Agents  Renal/GU Renal diseaseK+ 4.0 Cr 0.63     Musculoskeletal negative musculoskeletal ROS (+)   Abdominal   Peds  Hematology hgb 12.5   Anesthesia Other Findings   Reproductive/Obstetrics                            Anesthesia Physical Anesthesia Plan  ASA: III  Anesthesia Plan: General   Post-op Pain Management:    Induction:   PONV Risk Score and Plan: 4 or greater and Treatment may vary due to age or medical condition, Ondansetron, Dexamethasone and Midazolam  Airway Management Planned: Oral ETT  Additional Equipment: None  Intra-op Plan:   Post-operative Plan: Extubation in OR  Informed Consent: I have reviewed the patients History and Physical, chart, labs and discussed the procedure including the risks, benefits and alternatives for the proposed anesthesia with the patient or authorized representative who has indicated his/her understanding and acceptance.     Dental advisory given  Plan Discussed with:   Anesthesia Plan Comments:        Anesthesia Quick Evaluation

## 2020-03-19 ENCOUNTER — Encounter (HOSPITAL_COMMUNITY): Payer: Self-pay | Admitting: Urology

## 2020-03-19 ENCOUNTER — Ambulatory Visit (HOSPITAL_COMMUNITY): Payer: BC Managed Care – PPO | Admitting: Anesthesiology

## 2020-03-19 ENCOUNTER — Observation Stay (HOSPITAL_COMMUNITY)
Admission: RE | Admit: 2020-03-19 | Discharge: 2020-03-20 | Disposition: A | Discharge status: Home / Self Care | Payer: BC Managed Care – PPO | Attending: Urology | Admitting: Urology

## 2020-03-19 ENCOUNTER — Other Ambulatory Visit: Payer: Self-pay

## 2020-03-19 ENCOUNTER — Encounter (HOSPITAL_COMMUNITY)
Admission: RE | Disposition: A | Discharge status: Home / Self Care | Payer: Self-pay | Source: Home / Self Care | Attending: Urology

## 2020-03-19 DIAGNOSIS — R519 Headache, unspecified: Secondary | ICD-10-CM | POA: Diagnosis not present

## 2020-03-19 DIAGNOSIS — E118 Type 2 diabetes mellitus with unspecified complications: Secondary | ICD-10-CM | POA: Insufficient documentation

## 2020-03-19 DIAGNOSIS — Z8249 Family history of ischemic heart disease and other diseases of the circulatory system: Secondary | ICD-10-CM | POA: Diagnosis not present

## 2020-03-19 DIAGNOSIS — Z8673 Personal history of transient ischemic attack (TIA), and cerebral infarction without residual deficits: Secondary | ICD-10-CM | POA: Diagnosis not present

## 2020-03-19 DIAGNOSIS — Z79899 Other long term (current) drug therapy: Secondary | ICD-10-CM | POA: Insufficient documentation

## 2020-03-19 DIAGNOSIS — N814 Uterovaginal prolapse, unspecified: Secondary | ICD-10-CM | POA: Diagnosis present

## 2020-03-19 DIAGNOSIS — Z7984 Long term (current) use of oral hypoglycemic drugs: Secondary | ICD-10-CM | POA: Insufficient documentation

## 2020-03-19 DIAGNOSIS — Z841 Family history of disorders of kidney and ureter: Secondary | ICD-10-CM | POA: Insufficient documentation

## 2020-03-19 DIAGNOSIS — I10 Essential (primary) hypertension: Secondary | ICD-10-CM | POA: Insufficient documentation

## 2020-03-19 DIAGNOSIS — N3946 Mixed incontinence: Secondary | ICD-10-CM | POA: Diagnosis not present

## 2020-03-19 DIAGNOSIS — Z886 Allergy status to analgesic agent status: Secondary | ICD-10-CM | POA: Insufficient documentation

## 2020-03-19 DIAGNOSIS — Z803 Family history of malignant neoplasm of breast: Secondary | ICD-10-CM | POA: Diagnosis not present

## 2020-03-19 DIAGNOSIS — K219 Gastro-esophageal reflux disease without esophagitis: Secondary | ICD-10-CM | POA: Diagnosis not present

## 2020-03-19 DIAGNOSIS — Z880 Allergy status to penicillin: Secondary | ICD-10-CM | POA: Insufficient documentation

## 2020-03-19 DIAGNOSIS — Z833 Family history of diabetes mellitus: Secondary | ICD-10-CM | POA: Insufficient documentation

## 2020-03-19 DIAGNOSIS — N8111 Cystocele, midline: Secondary | ICD-10-CM | POA: Diagnosis not present

## 2020-03-19 DIAGNOSIS — Z818 Family history of other mental and behavioral disorders: Secondary | ICD-10-CM | POA: Diagnosis not present

## 2020-03-19 DIAGNOSIS — F419 Anxiety disorder, unspecified: Secondary | ICD-10-CM | POA: Insufficient documentation

## 2020-03-19 HISTORY — PX: CYSTOCELE REPAIR: SHX163

## 2020-03-19 LAB — GLUCOSE, CAPILLARY
Glucose-Capillary: 87 mg/dL (ref 70–99)
Glucose-Capillary: 127 mg/dL — ABNORMAL HIGH (ref 70–99)
Glucose-Capillary: 126 mg/dL — ABNORMAL HIGH (ref 70–99)
Glucose-Capillary: 109 mg/dL — ABNORMAL HIGH (ref 70–99)

## 2020-03-19 LAB — TYPE AND SCREEN
ABO/RH(D): O POS
Antibody Screen: NEGATIVE

## 2020-03-19 LAB — HEMOGLOBIN AND HEMATOCRIT, BLOOD
HCT: 35.8 % — ABNORMAL LOW (ref 36.0–46.0)
Hemoglobin: 11.6 g/dL — ABNORMAL LOW (ref 12.0–15.0)

## 2020-03-19 SURGERY — COLPORRHAPHY, ANTERIOR, FOR CYSTOCELE REPAIR
Anesthesia: General

## 2020-03-19 MED ORDER — CLINDAMYCIN PHOSPHATE 2 % VA CREA
TOPICAL_CREAM | VAGINAL | Status: AC
  Filled 2020-03-19: qty 80

## 2020-03-19 MED ORDER — PHENAZOPYRIDINE HCL 200 MG PO TABS
200.0000 mg | ORAL_TABLET | Freq: Once | ORAL | Status: AC
  Administered 2020-03-19: 200 mg via ORAL
  Filled 2020-03-19: qty 1

## 2020-03-19 MED ORDER — DIPHENHYDRAMINE HCL 12.5 MG/5ML PO ELIX: 12.5000 mg | ORAL_SOLUTION | Freq: Four times a day (QID) | ORAL | Status: DC | PRN

## 2020-03-19 MED ORDER — DOCUSATE SODIUM 100 MG PO CAPS
100.0000 mg | ORAL_CAPSULE | Freq: Two times a day (BID) | ORAL | Status: DC
  Administered 2020-03-19 – 2020-03-20 (×2): 100 mg via ORAL
  Filled 2020-03-19 (×2): qty 1

## 2020-03-19 MED ORDER — LIDOCAINE-EPINEPHRINE (PF) 1 %-1:200000 IJ SOLN
INTRAMUSCULAR | Status: DC | PRN
  Administered 2020-03-19: 20 mL

## 2020-03-19 MED ORDER — MIDAZOLAM HCL 2 MG/2ML IJ SOLN
INTRAMUSCULAR | Status: AC
  Filled 2020-03-19: qty 2

## 2020-03-19 MED ORDER — CHLORHEXIDINE GLUCONATE CLOTH 2 % EX PADS
6.0000 | MEDICATED_PAD | Freq: Every day | CUTANEOUS | Status: DC
  Administered 2020-03-19: 6 via TOPICAL

## 2020-03-19 MED ORDER — WATER FOR IRRIGATION, STERILE IR SOLN
Status: DC | PRN
  Administered 2020-03-19: 500 mL

## 2020-03-19 MED ORDER — FAMOTIDINE 20 MG PO TABS: 20.0000 mg | ORAL_TABLET | Freq: Every day | ORAL | Status: DC | PRN

## 2020-03-19 MED ORDER — ONDANSETRON HCL 4 MG/2ML IJ SOLN
INTRAMUSCULAR | Status: DC | PRN
  Administered 2020-03-19: 4 mg via INTRAVENOUS

## 2020-03-19 MED ORDER — ONDANSETRON HCL 4 MG/2ML IJ SOLN: 4.0000 mg | INTRAMUSCULAR | Status: DC | PRN

## 2020-03-19 MED ORDER — MEPERIDINE HCL 50 MG/ML IJ SOLN: 6.2500 mg | INTRAMUSCULAR | Status: DC | PRN

## 2020-03-19 MED ORDER — FESOTERODINE FUMARATE ER 8 MG PO TB24
8.0000 mg | ORAL_TABLET | Freq: Every day | ORAL | Status: DC
  Administered 2020-03-19 – 2020-03-20 (×2): 8 mg via ORAL
  Filled 2020-03-19 (×2): qty 1

## 2020-03-19 MED ORDER — PROPOFOL 10 MG/ML IV BOLUS
INTRAVENOUS | Status: AC
  Filled 2020-03-19: qty 20

## 2020-03-19 MED ORDER — SUGAMMADEX SODIUM 500 MG/5ML IV SOLN
INTRAVENOUS | Status: AC
  Filled 2020-03-19: qty 5

## 2020-03-19 MED ORDER — LISDEXAMFETAMINE DIMESYLATE 50 MG PO CAPS: 50.0000 mg | ORAL_CAPSULE | Freq: Every morning | ORAL | Status: DC

## 2020-03-19 MED ORDER — HYDROMORPHONE HCL 1 MG/ML IJ SOLN
INTRAMUSCULAR | Status: AC
  Administered 2020-03-19: 0.25 mg via INTRAVENOUS
  Filled 2020-03-19: qty 1

## 2020-03-19 MED ORDER — PROPOFOL 10 MG/ML IV BOLUS
INTRAVENOUS | Status: DC | PRN
  Administered 2020-03-19: 150 mg via INTRAVENOUS

## 2020-03-19 MED ORDER — ACETAMINOPHEN 10 MG/ML IV SOLN
INTRAVENOUS | Status: AC
  Filled 2020-03-19: qty 100

## 2020-03-19 MED ORDER — CLINDAMYCIN PHOSPHATE 2 % VA CREA
TOPICAL_CREAM | VAGINAL | Status: DC | PRN
  Administered 2020-03-19: 1 via VAGINAL

## 2020-03-19 MED ORDER — IRBESARTAN 75 MG PO TABS
75.0000 mg | ORAL_TABLET | Freq: Every day | ORAL | Status: DC
  Administered 2020-03-19 – 2020-03-20 (×2): 75 mg via ORAL
  Filled 2020-03-19 (×2): qty 1

## 2020-03-19 MED ORDER — ONDANSETRON HCL 4 MG/2ML IJ SOLN: 4.0000 mg | Freq: Once | INTRAMUSCULAR | Status: DC | PRN

## 2020-03-19 MED ORDER — ONDANSETRON HCL 4 MG/2ML IJ SOLN
INTRAMUSCULAR | Status: AC
  Filled 2020-03-19: qty 2

## 2020-03-19 MED ORDER — LISDEXAMFETAMINE DIMESYLATE 30 MG PO CAPS
50.0000 mg | ORAL_CAPSULE | Freq: Every morning | ORAL | Status: DC
  Administered 2020-03-20: 50 mg via ORAL
  Filled 2020-03-19: qty 1

## 2020-03-19 MED ORDER — ACETAMINOPHEN 10 MG/ML IV SOLN
1000.0000 mg | Freq: Once | INTRAVENOUS | Status: DC | PRN
  Administered 2020-03-19: 1000 mg via INTRAVENOUS

## 2020-03-19 MED ORDER — LIDOCAINE-EPINEPHRINE (PF) 1 %-1:200000 IJ SOLN
INTRAMUSCULAR | Status: AC
  Filled 2020-03-19: qty 60

## 2020-03-19 MED ORDER — SODIUM CHLORIDE 0.45 % IV SOLN: INTRAVENOUS | Status: DC

## 2020-03-19 MED ORDER — HYDROCODONE-ACETAMINOPHEN 10-325 MG PO TABS: 1.0000 | ORAL_TABLET | ORAL | 0 refills | Status: AC | PRN

## 2020-03-19 MED ORDER — LIDOCAINE HCL (CARDIAC) PF 100 MG/5ML IV SOSY
PREFILLED_SYRINGE | INTRAVENOUS | Status: DC | PRN
  Administered 2020-03-19: 100 mg via INTRAVENOUS

## 2020-03-19 MED ORDER — INSULIN ASPART 100 UNIT/ML ~~LOC~~ SOLN
0.0000 [IU] | Freq: Three times a day (TID) | SUBCUTANEOUS | Status: DC
  Administered 2020-03-19 – 2020-03-20 (×2): 2 [IU] via SUBCUTANEOUS

## 2020-03-19 MED ORDER — MIDAZOLAM HCL 5 MG/5ML IJ SOLN
INTRAMUSCULAR | Status: DC | PRN
  Administered 2020-03-19: 2 mg via INTRAVENOUS

## 2020-03-19 MED ORDER — OXYCODONE HCL 5 MG PO TABS
5.0000 mg | ORAL_TABLET | Freq: Once | ORAL | Status: AC | PRN
  Administered 2020-03-19: 5 mg via ORAL

## 2020-03-19 MED ORDER — LACTATED RINGERS IV SOLN: INTRAVENOUS | Status: DC

## 2020-03-19 MED ORDER — HYDROMORPHONE HCL 1 MG/ML IJ SOLN
0.5000 mg | INTRAMUSCULAR | Status: DC | PRN
  Administered 2020-03-19: 0.5 mg via INTRAVENOUS
  Administered 2020-03-19 – 2020-03-20 (×2): 1 mg via INTRAVENOUS
  Administered 2020-03-20: 0.5 mg via INTRAVENOUS
  Filled 2020-03-19 (×4): qty 1

## 2020-03-19 MED ORDER — PREGABALIN 75 MG PO CAPS
150.0000 mg | ORAL_CAPSULE | Freq: Two times a day (BID) | ORAL | Status: DC
  Administered 2020-03-19 – 2020-03-20 (×2): 150 mg via ORAL
  Filled 2020-03-19 (×2): qty 2

## 2020-03-19 MED ORDER — DEXAMETHASONE SODIUM PHOSPHATE 10 MG/ML IJ SOLN
INTRAMUSCULAR | Status: DC | PRN
  Administered 2020-03-19: 8 mg via INTRAVENOUS

## 2020-03-19 MED ORDER — SODIUM CHLORIDE 0.9 % IV SOLN
INTRAVENOUS | Status: AC
  Filled 2020-03-19 (×2): qty 500000

## 2020-03-19 MED ORDER — OXYCODONE HCL 5 MG PO TABS
ORAL_TABLET | ORAL | Status: AC
  Filled 2020-03-19: qty 1

## 2020-03-19 MED ORDER — LIDOCAINE 2% (20 MG/ML) 5 ML SYRINGE
INTRAMUSCULAR | Status: AC
  Filled 2020-03-19: qty 5

## 2020-03-19 MED ORDER — HYDROCODONE-ACETAMINOPHEN 10-325 MG PO TABS
1.0000 | ORAL_TABLET | ORAL | Status: DC | PRN
  Administered 2020-03-20: 1 via ORAL
  Filled 2020-03-19: qty 1

## 2020-03-19 MED ORDER — ROCURONIUM BROMIDE 100 MG/10ML IV SOLN
INTRAVENOUS | Status: DC | PRN
  Administered 2020-03-19: 50 mg via INTRAVENOUS
  Administered 2020-03-19 (×3): 10 mg via INTRAVENOUS

## 2020-03-19 MED ORDER — FENTANYL CITRATE (PF) 250 MCG/5ML IJ SOLN
INTRAMUSCULAR | Status: AC
  Filled 2020-03-19: qty 5

## 2020-03-19 MED ORDER — SODIUM CHLORIDE 0.9 % IV SOLN
INTRAVENOUS | Status: DC | PRN
  Administered 2020-03-19: 500 mL

## 2020-03-19 MED ORDER — MECLIZINE HCL 25 MG PO TABS: 25.0000 mg | ORAL_TABLET | Freq: Three times a day (TID) | ORAL | Status: DC | PRN

## 2020-03-19 MED ORDER — DIPHENHYDRAMINE HCL 50 MG/ML IJ SOLN: 12.5000 mg | Freq: Four times a day (QID) | INTRAMUSCULAR | Status: DC | PRN

## 2020-03-19 MED ORDER — DULOXETINE HCL 60 MG PO CPEP
60.0000 mg | ORAL_CAPSULE | Freq: Every day | ORAL | Status: DC
  Administered 2020-03-20: 60 mg via ORAL
  Filled 2020-03-19: qty 1

## 2020-03-19 MED ORDER — OXCARBAZEPINE 300 MG PO TABS
600.0000 mg | ORAL_TABLET | Freq: Every day | ORAL | Status: DC
  Administered 2020-03-19: 600 mg via ORAL
  Filled 2020-03-19 (×2): qty 2

## 2020-03-19 MED ORDER — LIFITEGRAST 5 % OP SOLN: 1.0000 [drp] | Freq: Two times a day (BID) | OPHTHALMIC | Status: DC

## 2020-03-19 MED ORDER — CLINDAMYCIN PHOSPHATE 600 MG/50ML IV SOLN
600.0000 mg | INTRAVENOUS | Status: AC
  Administered 2020-03-19: 600 mg via INTRAVENOUS
  Filled 2020-03-19: qty 50

## 2020-03-19 MED ORDER — OXYCODONE HCL 5 MG/5ML PO SOLN: 5.0000 mg | Freq: Once | ORAL | Status: AC | PRN

## 2020-03-19 MED ORDER — DEXAMETHASONE SODIUM PHOSPHATE 10 MG/ML IJ SOLN
INTRAMUSCULAR | Status: AC
  Filled 2020-03-19: qty 1

## 2020-03-19 MED ORDER — SUGAMMADEX SODIUM 500 MG/5ML IV SOLN
INTRAVENOUS | Status: DC | PRN
  Administered 2020-03-19: 300 mg via INTRAVENOUS

## 2020-03-19 MED ORDER — CITALOPRAM HYDROBROMIDE 20 MG PO TABS
20.0000 mg | ORAL_TABLET | Freq: Every day | ORAL | Status: DC
  Administered 2020-03-19: 20 mg via ORAL
  Filled 2020-03-19: qty 1

## 2020-03-19 MED ORDER — HYDROMORPHONE HCL 1 MG/ML IJ SOLN
0.2500 mg | INTRAMUSCULAR | Status: DC | PRN
  Administered 2020-03-19: 0.25 mg via INTRAVENOUS
  Administered 2020-03-19: 0.5 mg via INTRAVENOUS

## 2020-03-19 MED ORDER — ACETAMINOPHEN 325 MG PO TABS: 650.0000 mg | ORAL_TABLET | ORAL | Status: DC | PRN

## 2020-03-19 MED ORDER — FENTANYL CITRATE (PF) 100 MCG/2ML IJ SOLN
INTRAMUSCULAR | Status: DC | PRN
  Administered 2020-03-19: 50 ug via INTRAVENOUS
  Administered 2020-03-19: 100 ug via INTRAVENOUS
  Administered 2020-03-19: 25 ug via INTRAVENOUS

## 2020-03-19 MED ORDER — PRAVASTATIN SODIUM 10 MG PO TABS
10.0000 mg | ORAL_TABLET | Freq: Every evening | ORAL | Status: DC
  Administered 2020-03-19: 10 mg via ORAL
  Filled 2020-03-19 (×2): qty 1

## 2020-03-19 SURGICAL SUPPLY — 55 items
BAG DECANTER FOR FLEXI CONT (MISCELLANEOUS) ×3 IMPLANT
BAG URINE DRAIN 2000ML AR STRL (UROLOGICAL SUPPLIES) ×3 IMPLANT
BLADE SURG 15 STRL LF DISP TIS (BLADE) ×1 IMPLANT
BLADE SURG 15 STRL SS (BLADE) ×3
BRIEF STRETCH FOR OB PAD LRG (UNDERPADS AND DIAPERS) ×3 IMPLANT
CATH FOLEY 2WAY SLVR  5CC 14FR (CATHETERS) ×3
CATH FOLEY 2WAY SLVR 5CC 14FR (CATHETERS) ×1 IMPLANT
COVER MAYO STAND STRL (DRAPES) ×3 IMPLANT
COVER SURGICAL LIGHT HANDLE (MISCELLANEOUS) ×3 IMPLANT
COVER WAND RF STERILE (DRAPES) IMPLANT
DECANTER SPIKE VIAL GLASS SM (MISCELLANEOUS) ×3 IMPLANT
DEVICE CAPIO SLIM SINGLE (INSTRUMENTS) IMPLANT
DRAIN PENROSE 0.25X18 (DRAIN) ×3 IMPLANT
DRAPE UNDERBUTTOCKS STRL (DISPOSABLE) ×3 IMPLANT
ELECT PENCIL ROCKER SW 15FT (MISCELLANEOUS) ×3 IMPLANT
GAUZE 4X4 16PLY RFD (DISPOSABLE) ×12 IMPLANT
GAUZE PACKING 1 X5 YD ST (GAUZE/BANDAGES/DRESSINGS) ×6 IMPLANT
GLOVE BIO SURGEON STRL SZ 6.5 (GLOVE) ×2 IMPLANT
GLOVE BIO SURGEONS STRL SZ 6.5 (GLOVE) ×1
GLOVE BIOGEL M STRL SZ7.5 (GLOVE) ×3 IMPLANT
GLOVE ECLIPSE 8.5 STRL (GLOVE) ×3 IMPLANT
GOWN STRL REUS W/TWL XL LVL3 (GOWN DISPOSABLE) ×3 IMPLANT
HOLDER FOLEY CATH W/STRAP (MISCELLANEOUS) ×3 IMPLANT
IV NS 1000ML (IV SOLUTION) ×3
IV NS 1000ML BAXH (IV SOLUTION) ×1 IMPLANT
KIT BASIN OR (CUSTOM PROCEDURE TRAY) ×3 IMPLANT
KIT TURNOVER KIT A (KITS) IMPLANT
NEEDLE HYPO 22GX1.5 SAFETY (NEEDLE) ×3 IMPLANT
NEEDLE MAYO 6 CRC TAPER PT (NEEDLE) ×3 IMPLANT
NS IRRIG 1000ML POUR BTL (IV SOLUTION) ×3 IMPLANT
PACK CYSTO (CUSTOM PROCEDURE TRAY) ×3 IMPLANT
PACKING VAGINAL (PACKING) ×3 IMPLANT
PAD OB MATERNITY 4.3X12.25 (PERSONAL CARE ITEMS) ×3 IMPLANT
PLUG CATH AND CAP STER (CATHETERS) ×3 IMPLANT
PROTECTOR NERVE ULNAR (MISCELLANEOUS) ×3 IMPLANT
RETRACTOR STAY HOOK 5MM (MISCELLANEOUS) ×3 IMPLANT
SHEET LAVH (DRAPES) ×3 IMPLANT
SUT CAPIO ETHIBPND (SUTURE) IMPLANT
SUT VIC AB 0 CT1 27 (SUTURE) ×3
SUT VIC AB 0 CT1 27XBRD ANTBC (SUTURE) ×1 IMPLANT
SUT VIC AB 2-0 CT1 27 (SUTURE) ×6
SUT VIC AB 2-0 CT1 27XBRD (SUTURE) ×2 IMPLANT
SUT VIC AB 2-0 SH 27 (SUTURE) ×6
SUT VIC AB 2-0 SH 27X BRD (SUTURE) ×2 IMPLANT
SUT VIC AB 3-0 SH 27 (SUTURE) ×15
SUT VIC AB 3-0 SH 27XBRD (SUTURE) ×5 IMPLANT
SUT VICRYL 0 UR6 27IN ABS (SUTURE) ×6 IMPLANT
SYR 10ML LL (SYRINGE) ×3 IMPLANT
TOWEL OR 17X26 10 PK STRL BLUE (TOWEL DISPOSABLE) ×3 IMPLANT
TOWEL OR NON WOVEN STRL DISP B (DISPOSABLE) ×3 IMPLANT
TUBING CONNECTING 10 (TUBING) ×2 IMPLANT
TUBING CONNECTING 10' (TUBING) ×1
UNDERPAD 30X36 HEAVY ABSORB (UNDERPADS AND DIAPERS) ×3 IMPLANT
WATER STERILE IRR 1000ML POUR (IV SOLUTION) ×3 IMPLANT
YANKAUER SUCT BULB TIP 10FT TU (MISCELLANEOUS) ×3 IMPLANT

## 2020-03-19 NOTE — Anesthesia Procedure Notes (Signed)
Procedure Name: Intubation Date/Time: 03/19/2020 7:46 AM Performed by: Glory Buff, CRNA Pre-anesthesia Checklist: Patient identified, Emergency Drugs available, Suction available and Patient being monitored Patient Re-evaluated:Patient Re-evaluated prior to induction Oxygen Delivery Method: Circle system utilized Preoxygenation: Pre-oxygenation with 100% oxygen Induction Type: IV induction Ventilation: Mask ventilation without difficulty Laryngoscope Size: Mac and 3 Grade View: Grade I Tube type: Oral Tube size: 7.0 mm Number of attempts: 1 Airway Equipment and Method: Stylet and Oral airway Placement Confirmation: ETT inserted through vocal cords under direct vision,  positive ETCO2 and breath sounds checked- equal and bilateral Secured at: 22 cm Tube secured with: Tape Dental Injury: Teeth and Oropharynx as per pre-operative assessment  Comments: Intubated by C. Devenger, SRNA

## 2020-03-19 NOTE — Transfer of Care (Signed)
Immediate Anesthesia Transfer of Care Note  Patient: Emily Carrillo  Procedure(s) Performed: CYSTOSCOPY ANTERIOR REPAIR (CYSTOCELE) (N/A )  Patient Location: PACU  Anesthesia Type:General  Level of Consciousness: drowsy, patient cooperative and responds to stimulation  Airway & Oxygen Therapy: Patient Spontanous Breathing and Patient connected to face mask oxygen  Post-op Assessment: Report given to RN and Post -op Vital signs reviewed and stable  Post vital signs: Reviewed and stable  Last Vitals:  Vitals Value Taken Time  BP 142/73 03/19/20 1030  Temp 36.9 C 03/19/20 1030  Pulse 73 03/19/20 1031  Resp 13 03/19/20 1031  SpO2 100 % 03/19/20 1031  Vitals shown include unvalidated device data.  Last Pain:  Vitals:   03/19/20 0547  TempSrc:   PainSc: 8          Complications: No apparent anesthesia complications

## 2020-03-19 NOTE — Anesthesia Postprocedure Evaluation (Signed)
Anesthesia Post Note  Patient: Emily Carrillo  Procedure(s) Performed: CYSTOSCOPY ANTERIOR REPAIR (CYSTOCELE) (N/A )     Patient location during evaluation: PACU Anesthesia Type: General Level of consciousness: awake and alert Pain management: pain level controlled Vital Signs Assessment: post-procedure vital signs reviewed and stable Respiratory status: spontaneous breathing, nonlabored ventilation, respiratory function stable and patient connected to nasal cannula oxygen Cardiovascular status: blood pressure returned to baseline and stable Postop Assessment: no apparent nausea or vomiting Anesthetic complications: no    Last Vitals:  Vitals:   03/19/20 1145 03/19/20 1200  BP: 108/67 99/63  Pulse: 67 70  Resp: 18 15  Temp:    SpO2: 100% 100%    Last Pain:  Vitals:   03/19/20 1130  TempSrc:   PainSc: Asleep                 Trevor Iha

## 2020-03-19 NOTE — Op Note (Signed)
Preoperative diagnosis: Cystocele and mild vault prolapse Postoperative diagnosis: Midline cystocele and mild vault prolapse Surgery: Cystocele repair and cystoscopy Surgeon: Dr. Lorin Picket Hlee Fringer Assistant: Marchelle Folks dancy  The patient has the above diagnosis and consented to the above procedure.  Extra care was taken with leg positioning to minimize the risk of compartment syndrome and neuropathy and deep vein thrombosis.  Preoperative antibiotics were given.  Under anesthesia her vaginal cuff was well supported descending 2 cm.  She has a large central defect and her vaginal opening was adequate.  I instilled 20 cc of lidocaine epinephrine mixture.  I made a T-shaped incision with my Allis clamps and mobilized the overlying vaginal epithelium from the underlying pubocervical fascia to the white line bilaterally.  I was careful and mobilized well at the apex.  I did a 2 layer repair with running 2-0 Vicryl not shortening the repair and not imbricating the bladder neck.  It was anatomic.  I reduced the long cystocele and long anterior vaginal wall very well.  I cystoscoped the patient.  She had excellent repair cystoscopically.  Ureteral orifices were normal position with excellent efflux bilaterally  I took down my retractor and once again the apex was well supported.  She did not need a vault suspension.  I trimmed an appropriate amount of anterior vaginal epithelium being very careful to excise  because it was so long.  I closed the anterior vaginal epithelium with running 2-0 Vicryl on a CT1 needle.  I reinspected the vaginal vault and she had excellent support anteriorly and very good length.  Vaginal pack with clindamycin cream inserted.  Blood loss less than 100 mL.  Leg position good.  Urine output excellent.  Hopefully the patient will reach her treatment goal

## 2020-03-19 NOTE — Interval H&P Note (Signed)
History and Physical Interval Note:  03/19/2020 7:24 AM  Emily Carrillo  has presented today for surgery, with the diagnosis of CYSTOCELE VAULT PROLAPSE.  The various methods of treatment have been discussed with the patient and family. After consideration of risks, benefits and other options for treatment, the patient has consented to  Procedure(s): CYSTOSCOPY ANTERIOR REPAIR (CYSTOCELE) (N/A) VAGINAL VAULT SUSPENSION AND GRAFT (N/A) as a surgical intervention.  The patient's history has been reviewed, patient examined, no change in status, stable for surgery.  I have reviewed the patient's chart and labs.  Questions were answered to the patient's satisfaction.     Kerina Simoneau A Kirill Chatterjee

## 2020-03-20 DIAGNOSIS — N8111 Cystocele, midline: Secondary | ICD-10-CM | POA: Diagnosis not present

## 2020-03-20 LAB — BASIC METABOLIC PANEL
Anion gap: 7 (ref 5–15)
BUN: 16 mg/dL (ref 6–20)
CO2: 25 mmol/L (ref 22–32)
Calcium: 8.2 mg/dL — ABNORMAL LOW (ref 8.9–10.3)
Chloride: 108 mmol/L (ref 98–111)
Creatinine, Ser: 0.74 mg/dL (ref 0.44–1.00)
GFR calc Af Amer: >60 mL/min (ref >60)
GFR calc non Af Amer: >60 mL/min (ref >60)
Glucose, Bld: 149 mg/dL — ABNORMAL HIGH (ref 70–99)
Potassium: 3.4 mmol/L — ABNORMAL LOW (ref 3.5–5.1)
Sodium: 140 mmol/L (ref 135–145)

## 2020-03-20 LAB — GLUCOSE, CAPILLARY
Glucose-Capillary: 85 mg/dL (ref 70–99)
Glucose-Capillary: 122 mg/dL — ABNORMAL HIGH (ref 70–99)
Glucose-Capillary: 94 mg/dL (ref 70–99)

## 2020-03-20 LAB — HEMOGLOBIN AND HEMATOCRIT, BLOOD
HCT: 33.4 % — ABNORMAL LOW (ref 36.0–46.0)
Hemoglobin: 10.8 g/dL — ABNORMAL LOW (ref 12.0–15.0)

## 2020-03-20 NOTE — Progress Notes (Signed)
Foley catheter and vaginal packing removed. Patient tolerated well. Will await first void and bladder scan for post void residual.

## 2020-03-20 NOTE — Discharge Summary (Signed)
Date of admission: 03/19/2020  Date of discharge: 03/20/2020  Admission diagnosis: midline cystocele  Discharge diagnosis: midline cystocele  Secondary diagnoses: mild vault prolapse  History and Physical: For full details, please see admission history and physical. Briefly, Emily Carrillo is a 49 y.o. year old patient with the above diagnsosis.   Hospital Course: Cystocele repair and cysto; good post op course  Laboratory values:  Recent Labs    03/19/20 1554  HGB 11.6*  HCT 35.8*   No results for input(s): CREATININE in the last 72 hours.  Disposition: Home  Discharge instruction: The patient was instructed to be ambulatory but told to refrain from heavy lifting, strenuous activity, or driving. Detailed  Discharge medications:  Allergies as of 03/20/2020      Reactions   Imitrex [sumatriptan] Anaphylaxis, Other (See Comments)   Burning all over   Amoxicillin Itching, Rash   Did it involve swelling of the face/tongue/throat, SOB, or low BP? No Did it involve sudden or severe rash/hives, skin peeling, or any reaction on the inside of your mouth or nose? Yes Did you need to seek medical attention at a hospital or doctor's office? Yes When did it last happen?20 + years If all above answers are "NO", may proceed with cephalosporin use.   Ibuprofen Itching, Rash   Penicillins Itching, Rash   Did it involve swelling of the face/tongue/throat, SOB, or low BP? No Did it involve sudden or severe rash/hives, skin peeling, or any reaction on the inside of your mouth or nose? Yes Did you need to seek medical attention at a hospital or doctor's office? Yes When did it last happen?20 + years If all above answers are "NO", may proceed with cephalosporin use.      Medication List    STOP taking these medications   AIRBORNE PO   COLLAGEN PO   Vitamin D (Ergocalciferol) 1.25 MG (50000 UNIT) Caps capsule Commonly known as: DRISDOL     TAKE these medications    citalopram 20 MG tablet Commonly known as: CELEXA Take 20 mg by mouth at bedtime.   clindamycin 150 MG capsule Commonly known as: CLEOCIN Take 150 mg by mouth 4 (four) times daily.   cyclobenzaprine 5 MG tablet Commonly known as: FLEXERIL Take 5 mg by mouth 3 (three) times daily as needed for muscle spasms.   Diethylpropion HCl 25 MG Tabs Take 25 mg by mouth 3 (three) times daily before meals.   DULoxetine 60 MG capsule Commonly known as: CYMBALTA Take 60 mg by mouth daily.   famotidine 20 MG tablet Commonly known as: PEPCID Take 20 mg by mouth daily as needed for heartburn or indigestion.   Fiber Powd Take 1 Dose by mouth daily.   HYDROcodone-acetaminophen 10-325 MG tablet Commonly known as: NORCO Take 1 tablet by mouth every 4 (four) hours as needed for moderate pain.   lactulose 10 GM/15ML solution Commonly known as: CHRONULAC Take 15 mLs by mouth daily as needed for constipation.   lidocaine 5 % ointment Commonly known as: XYLOCAINE Apply 1 application topically 2 (two) times daily as needed (left shoulder pain).   meclizine 25 MG tablet Commonly known as: ANTIVERT Take 25 mg by mouth 3 (three) times daily as needed for dizziness.   metFORMIN 500 MG tablet Commonly known as: GLUCOPHAGE Take 500 mg by mouth 2 (two) times daily with a meal.   Narcan 4 MG/0.1ML Liqd nasal spray kit Generic drug: naloxone Place 1 spray into the nose once.   oxcarbazepine  600 MG tablet Commonly known as: TRILEPTAL Take 600 mg by mouth at bedtime.   pravastatin 10 MG tablet Commonly known as: PRAVACHOL Take 10 mg by mouth every evening.   pregabalin 150 MG capsule Commonly known as: LYRICA Take 150 mg by mouth 2 (two) times daily.   SYSTANE OP Place 1 drop into both eyes at bedtime as needed (dry eyes).   telmisartan 20 MG tablet Commonly known as: MICARDIS Take 20 mg by mouth daily.   tolterodine 4 MG 24 hr capsule Commonly known as: DETROL LA Take 4 mg by mouth  daily.   triamcinolone cream 0.1 % Commonly known as: KENALOG Apply 1 application topically in the morning and at bedtime.   Vyvanse 50 MG capsule Generic drug: lisdexamfetamine Take 50 mg by mouth every morning.   Xiidra 5 % Soln Generic drug: Lifitegrast Place 1 drop into both eyes in the morning and at bedtime.   Yuvafem 10 MCG Tabs vaginal tablet Generic drug: Estradiol Place 10 mcg vaginally 2 (two) times a week.   zolpidem 10 MG tablet Commonly known as: AMBIEN Take 10 mg by mouth at bedtime.       Followup:  Follow-up Information    Aliena Ghrist, Nicki Reaper, MD.   Specialty: Urology Why: office will call you with date and time of appt. Contact information: [place] [address] [phone]

## 2020-03-20 NOTE — Discharge Instructions (Signed)
I have reviewed discharge instructions in detail with the patient. They will follow-up with me or their physician as scheduled. My nurse will also be calling the patients as per protocol. As discussed with Dr. Adamary Savary. ° °You may resume aspirin, advil, aleve, vitamins, and supplements 7 days after surgery. °

## 2020-03-20 NOTE — Progress Notes (Signed)
Looks good Vitals good Labs good  Post op detailed  Void trial

## 2021-05-03 ENCOUNTER — Other Ambulatory Visit: Payer: Self-pay

## 2021-05-03 DIAGNOSIS — I83893 Varicose veins of bilateral lower extremities with other complications: Secondary | ICD-10-CM

## 2021-05-29 ENCOUNTER — Encounter (HOSPITAL_COMMUNITY): Payer: BC Managed Care – PPO

## 2021-06-12 ENCOUNTER — Ambulatory Visit (HOSPITAL_COMMUNITY): Payer: BC Managed Care – PPO | Attending: Vascular Surgery

## 2021-08-14 ENCOUNTER — Other Ambulatory Visit (HOSPITAL_BASED_OUTPATIENT_CLINIC_OR_DEPARTMENT_OTHER): Payer: Self-pay | Admitting: Nurse Practitioner

## 2021-08-14 ENCOUNTER — Other Ambulatory Visit (HOSPITAL_COMMUNITY): Payer: Self-pay | Admitting: Nurse Practitioner

## 2021-08-14 DIAGNOSIS — R1310 Dysphagia, unspecified: Secondary | ICD-10-CM

## 2021-08-27 ENCOUNTER — Ambulatory Visit (HOSPITAL_COMMUNITY): Payer: BC Managed Care – PPO

## 2021-08-27 ENCOUNTER — Encounter (HOSPITAL_COMMUNITY): Payer: Self-pay

## 2021-09-15 ENCOUNTER — Ambulatory Visit (HOSPITAL_COMMUNITY): Admission: RE | Admit: 2021-09-15 | Payer: BC Managed Care – PPO | Source: Ambulatory Visit

## 2021-11-19 ENCOUNTER — Other Ambulatory Visit: Payer: Self-pay | Admitting: Geriatric Medicine

## 2021-11-19 ENCOUNTER — Other Ambulatory Visit (HOSPITAL_COMMUNITY): Payer: Self-pay | Admitting: Geriatric Medicine

## 2021-11-19 DIAGNOSIS — R1310 Dysphagia, unspecified: Secondary | ICD-10-CM

## 2021-12-12 ENCOUNTER — Inpatient Hospital Stay (HOSPITAL_COMMUNITY): Admission: RE | Admit: 2021-12-12 | Payer: BC Managed Care – PPO | Source: Ambulatory Visit

## 2021-12-23 ENCOUNTER — Inpatient Hospital Stay (HOSPITAL_COMMUNITY): Admission: RE | Admit: 2021-12-23 | Payer: BC Managed Care – PPO | Source: Ambulatory Visit

## 2021-12-23 ENCOUNTER — Encounter (HOSPITAL_COMMUNITY): Payer: Self-pay

## 2022-01-07 ENCOUNTER — Ambulatory Visit (HOSPITAL_COMMUNITY)
Admission: RE | Admit: 2022-01-07 | Discharge: 2022-01-07 | Disposition: A | Discharge status: Home / Self Care | Payer: Medicare Other | Source: Ambulatory Visit | Attending: Geriatric Medicine | Admitting: Geriatric Medicine

## 2022-01-07 ENCOUNTER — Other Ambulatory Visit: Payer: Self-pay

## 2022-01-07 DIAGNOSIS — R1310 Dysphagia, unspecified: Secondary | ICD-10-CM | POA: Insufficient documentation

## 2022-02-10 ENCOUNTER — Other Ambulatory Visit: Payer: Self-pay | Admitting: Endocrinology

## 2022-02-10 DIAGNOSIS — Z1231 Encounter for screening mammogram for malignant neoplasm of breast: Secondary | ICD-10-CM

## 2022-09-03 ENCOUNTER — Emergency Department (HOSPITAL_COMMUNITY): Payer: Medicare Other

## 2022-09-03 ENCOUNTER — Encounter (HOSPITAL_COMMUNITY): Payer: Self-pay | Admitting: Emergency Medicine

## 2022-09-03 ENCOUNTER — Emergency Department (HOSPITAL_COMMUNITY)
Admission: EM | Admit: 2022-09-03 | Discharge: 2022-09-03 | Discharge status: Against Medical Advice | Payer: Medicare Other | Attending: Emergency Medicine | Admitting: Emergency Medicine

## 2022-09-03 DIAGNOSIS — R61 Generalized hyperhidrosis: Secondary | ICD-10-CM | POA: Insufficient documentation

## 2022-09-03 DIAGNOSIS — R110 Nausea: Secondary | ICD-10-CM | POA: Insufficient documentation

## 2022-09-03 DIAGNOSIS — E119 Type 2 diabetes mellitus without complications: Secondary | ICD-10-CM | POA: Diagnosis not present

## 2022-09-03 DIAGNOSIS — M79605 Pain in left leg: Secondary | ICD-10-CM | POA: Insufficient documentation

## 2022-09-03 DIAGNOSIS — R079 Chest pain, unspecified: Secondary | ICD-10-CM | POA: Insufficient documentation

## 2022-09-03 DIAGNOSIS — R0602 Shortness of breath: Secondary | ICD-10-CM | POA: Diagnosis not present

## 2022-09-03 DIAGNOSIS — Z5321 Procedure and treatment not carried out due to patient leaving prior to being seen by health care provider: Secondary | ICD-10-CM | POA: Diagnosis not present

## 2022-09-03 LAB — CBC
HCT: 39 % (ref 36.0–46.0)
Hemoglobin: 12.9 g/dL (ref 12.0–15.0)
MCH: 30.2 pg (ref 26.0–34.0)
MCHC: 33.1 g/dL (ref 30.0–36.0)
MCV: 91.3 fL (ref 80.0–100.0)
Platelets: 304 10*3/uL (ref 150–400)
RBC: 4.27 MIL/uL (ref 3.87–5.11)
RDW: 15.2 % (ref 11.5–15.5)
WBC: 4.1 10*3/uL (ref 4.0–10.5)
nRBC: 0 % (ref 0.0–0.2)

## 2022-09-03 LAB — BASIC METABOLIC PANEL
Anion gap: 7 (ref 5–15)
BUN: 19 mg/dL (ref 6–20)
CO2: 24 mmol/L (ref 22–32)
Calcium: 9.4 mg/dL (ref 8.9–10.3)
Chloride: 108 mmol/L (ref 98–111)
Creatinine, Ser: 0.87 mg/dL (ref 0.44–1.00)
GFR, Estimated: >60 mL/min (ref >60)
Glucose, Bld: 101 mg/dL — ABNORMAL HIGH (ref 70–99)
Potassium: 3.6 mmol/L (ref 3.5–5.1)
Sodium: 139 mmol/L (ref 135–145)

## 2022-09-03 LAB — TROPONIN I (HIGH SENSITIVITY): Troponin I (High Sensitivity): 2 ng/L (ref <18)

## 2022-09-03 NOTE — ED Triage Notes (Signed)
Patient c/o intermittent central chest pain with SOB x1 week, constant today. Also reports headache x 1 week.

## 2022-09-03 NOTE — ED Provider Triage Note (Signed)
Emergency Medicine Provider Triage Evaluation Note  Emily Carrillo , a 51 y.o. female  was evaluated in triage.  Pt complains of chest pain. Started 1 week ago. Located in the center of her chest and radiates down left arm. Started sharp but now dull in sensation. Endorses SOB, nausea, and diaphoresis.  Diabetes.  Also endorses left calf tenderness.  Cannot remember when it started.  Review of Systems  Positive: See above Negative: See above  Physical Exam  BP 136/82   Pulse 86   Temp 99.4 F (37.4 C)   Resp 20   SpO2 100%  Gen:   Awake, anxious Resp:  Normal effort  MSK:   Moves extremities without difficulty  Other:    Medical Decision Making  Medically screening exam initiated at 3:13 PM.  Appropriate orders placed.  Seabron Spates was informed that the remainder of the evaluation will be completed by another provider, this initial triage assessment does not replace that evaluation, and the importance of remaining in the ED until their evaluation is complete.     Gareth Eagle, PA-C 09/03/22 [phone]

## 2022-12-18 ENCOUNTER — Emergency Department (HOSPITAL_COMMUNITY): Payer: Medicare Other

## 2022-12-18 ENCOUNTER — Emergency Department (HOSPITAL_COMMUNITY)
Admission: EM | Admit: 2022-12-18 | Discharge: 2022-12-18 | Disposition: A | Discharge status: Home / Self Care | Payer: Medicare Other | Attending: Student | Admitting: Student

## 2022-12-18 ENCOUNTER — Other Ambulatory Visit: Payer: Self-pay

## 2022-12-18 DIAGNOSIS — Z79899 Other long term (current) drug therapy: Secondary | ICD-10-CM | POA: Diagnosis not present

## 2022-12-18 DIAGNOSIS — W01198A Fall on same level from slipping, tripping and stumbling with subsequent striking against other object, initial encounter: Secondary | ICD-10-CM | POA: Diagnosis not present

## 2022-12-18 DIAGNOSIS — W19XXXA Unspecified fall, initial encounter: Secondary | ICD-10-CM

## 2022-12-18 DIAGNOSIS — E119 Type 2 diabetes mellitus without complications: Secondary | ICD-10-CM | POA: Diagnosis not present

## 2022-12-18 DIAGNOSIS — Z7984 Long term (current) use of oral hypoglycemic drugs: Secondary | ICD-10-CM | POA: Insufficient documentation

## 2022-12-18 DIAGNOSIS — S0181XA Laceration without foreign body of other part of head, initial encounter: Secondary | ICD-10-CM | POA: Insufficient documentation

## 2022-12-18 DIAGNOSIS — I10 Essential (primary) hypertension: Secondary | ICD-10-CM | POA: Insufficient documentation

## 2022-12-18 NOTE — ED Triage Notes (Signed)
Patient states she lost balance and fell 1 hour ago. Patient has laceration to left forehead area. Bleeding controlled and band aid applied prior to arrival. Patient says she went to urgent care but was told to come to ED since head involved. Patient reports feeling nauseated. Denies pain

## 2022-12-18 NOTE — ED Provider Notes (Signed)
[facility] DEPT Provider Note   CSN: [id number] Arrival date & time: 12/18/22  1647     History  Chief Complaint  Patient presents with   Emily Carrillo is a 51 y.o. female.  Patient presents to the emergency department complaining of a laceration to her left forehead.  Patient states that she was bending over to pick up something off the floor when she lost her balance, falling forward and hitting her head on the corner of a table.  She remembers the incident and has had no nausea, vomiting since the incident.  She is not taking blood thinners.  She denies any dizziness at this time, denies chest pain, shortness of breath, amnesia.  The patient is concerned at this time about getting home due to having a 41-year-old and another child younger than that at home alone at this time. Past medical history significant for previous stroke, anxiety, type II DM without complication, hypertension HPI     Home Medications Prior to Admission medications   Medication Sig Start Date End Date Taking? Authorizing Provider  citalopram (CELEXA) 20 MG tablet Take 20 mg by mouth at bedtime.    [provider]  clindamycin (CLEOCIN) 150 MG capsule Take 150 mg by mouth 4 (four) times daily.    [provider]  cyclobenzaprine (FLEXERIL) 5 MG tablet Take 5 mg by mouth 3 (three) times daily as needed for muscle spasms. 03/09/20   [provider]  Diethylpropion HCl 25 MG TABS Take 25 mg by mouth 3 (three) times daily before meals.    [provider]  DULoxetine (CYMBALTA) 60 MG capsule Take 60 mg by mouth daily.    [provider]  Estradiol (YUVAFEM) 10 MCG TABS vaginal tablet Place 10 mcg vaginally 2 (two) times a week.    [provider]  famotidine (PEPCID) 20 MG tablet Take 20 mg by mouth daily as needed for heartburn or indigestion.    [provider]  Fiber POWD Take 1 Dose by mouth daily.    [provider]  HYDROcodone-acetaminophen (NORCO) 10-325 MG tablet Take 1 tablet by mouth every 4 (four) hours as needed for moderate pain. 03/19/20   Debbrah Alar, PA-C  lactulose (CHRONULAC) 10 GM/15ML solution Take 15 mLs by mouth daily as needed for constipation. 02/17/20   [provider]  lidocaine (XYLOCAINE) 5 % ointment Apply 1 application topically 2 (two) times daily as needed (left shoulder pain).  11/14/19   [provider]  meclizine (ANTIVERT) 25 MG tablet Take 25 mg by mouth 3 (three) times daily as needed for dizziness.    [provider]  metFORMIN (GLUCOPHAGE) 500 MG tablet Take 500 mg by mouth 2 (two) times daily with a meal.    [provider]  NARCAN 4 MG/0.1ML LIQD nasal spray kit Place 1 spray into the nose once. 03/09/20   [provider]  oxcarbazepine (TRILEPTAL) 600 MG tablet Take 600 mg by mouth at bedtime.    [provider]  Polyethyl Glycol-Propyl Glycol (SYSTANE OP) Place 1 drop into both eyes at bedtime as needed (dry eyes).    [provider]  pravastatin (PRAVACHOL) 10 MG tablet Take 10 mg by mouth every evening.    [provider]  pregabalin (LYRICA) 150 MG capsule Take 150 mg by mouth 2 (two) times daily.    [provider]  telmisartan (MICARDIS) 20 MG tablet Take 20 mg by mouth daily.  [provider]  tolterodine (DETROL LA) 4 MG 24 hr capsule Take 4 mg by mouth daily. 12/13/19   [provider]  triamcinolone cream (KENALOG) 0.1 % Apply 1 application topically in the morning and at bedtime. 01/17/20   [provider]  VYVANSE 50 MG capsule Take 50 mg by mouth every morning. 02/26/20   [provider]  XIIDRA 5 % SOLN Place 1 drop into both eyes in the morning and at bedtime. 03/11/20   [provider]  zolpidem (AMBIEN) 10 MG tablet Take 10 mg by mouth at bedtime.     [provider]      Allergies    Imitrex [sumatriptan],  Amoxicillin, Ibuprofen, and Penicillins    Review of Systems   Review of Systems  Skin:  Positive for wound.    Physical Exam Updated Vital Signs BP (!) 142/89 (BP Location: Left Arm)   Pulse 97   Temp 98.8 F (37.1 C) (Oral)   Resp 16   Ht _0  (1.626 m)   SpO2 100%   BMI 27.59 kg/m  Physical Exam Vitals and nursing note reviewed.  Constitutional:      General: She is not in acute distress.    Appearance: She is well-developed.  HENT:     Head: Normocephalic and atraumatic.  Eyes:     Extraocular Movements: Extraocular movements intact.     Conjunctiva/sclera: Conjunctivae normal.     Pupils: Pupils are equal, round, and reactive to light.  Cardiovascular:     Rate and Rhythm: Normal rate and regular rhythm.     Heart sounds: No murmur heard. Pulmonary:     Effort: Pulmonary effort is normal. No respiratory distress.     Breath sounds: Normal breath sounds.  Abdominal:     Palpations: Abdomen is soft.     Tenderness: There is no abdominal tenderness.  Musculoskeletal:        General: No swelling.     Cervical back: Neck supple.  Skin:    General: Skin is warm and dry.     Capillary Refill: Capillary refill takes less than 2 seconds.     Comments: 0.2 cm laceration noted on left forehead with small area of bruising around the wound.  No active bleeding  Neurological:     Mental Status: She is alert.     Sensory: No sensory deficit.     Motor: No weakness.     Coordination: Coordination normal.     Gait: Gait normal.     Comments: Cranial nerves II through VII, XI, XII intact  Psychiatric:        Mood and Affect: Mood normal.     ED Results / Procedures / Treatments   Labs (all labs ordered are listed, but only abnormal results are displayed) Labs Reviewed - No data to display  EKG None  Radiology No results found.  Procedures .Marland KitchenLaceration Repair  Date/Time: 12/18/2022 5:31 PM  Performed by: Dorothyann Peng, PA-C Authorized by: Dorothyann Peng, PA-C   Consent:    Consent obtained:  Verbal   Consent given by:  Patient   Risks, benefits, and alternatives were discussed: yes     Risks discussed:  Poor cosmetic result, poor wound healing, pain and infection   Alternatives discussed:  No treatment Universal protocol:    Procedure explained and questions answered to patient or proxy's satisfaction: yes     Relevant documents present and verified: yes     Immediately prior  to procedure, a time out was called: yes     Patient identity confirmed:  Verbally with patient Anesthesia:    Anesthesia method:  None Laceration details:    Location:  Face   Face location:  Forehead   Length (cm):  0.2   Depth (mm):  1 Pre-procedure details:    Preparation:  Patient was prepped and draped in usual sterile fashion Exploration:    Hemostasis achieved with:  Direct pressure   Wound exploration: entire depth of wound visualized     Contaminated: no   Treatment:    Area cleansed with:  Saline   Amount of cleaning:  Standard Skin repair:    Repair method:  Tissue adhesive Approximation:    Approximation:  Close Repair type:    Repair type:  Simple Post-procedure details:    Dressing:  Open (no dressing)   Procedure completion:  Tolerated well, no immediate complications     Medications Ordered in ED Medications - No data to display  ED Course/ Medical Decision Making/ A&P                           Medical Decision Making Amount and/or Complexity of Data Reviewed Radiology: ordered.   Patient presents with chief complaint of a laceration to the forehead.  Differential diagnosis includes but is not limited to intracranial injury, fracture, soft tissue injuries, and others  I discussed ordering a head CT with the patient.  She has no red flag symptoms at this time.  She has full memory of the event.  She did not lose consciousness.  She is having no projectile vomiting.  She is alert and oriented with a GCS of 15.  Patient  declined head CT at this time which seems perfectly reasonable.  There was a small laceration to the forehead with bleeding controlled.  This was cleaned with saline and repaired as noted above.  Surgical adhesive was used.  The patient is being discharged at this time with instructions on concussion care and laceration care.  The patient may wash the area starting tomorrow.  She has been instructed to let the glue fall off on its own.  She voices understanding of these instructions.  Return precautions were provided including change in mental status, projectile vomiting, or other life-threatening conditions.        Final Clinical Impression(s) / ED Diagnoses Final diagnoses:  Fall, initial encounter  Laceration of forehead, initial encounter    Rx / DC Orders ED Discharge Orders     None         Ronny Bacon 12/18/22 1734    Elgie Congo, MD 12/19/22 [phone]

## 2022-12-18 NOTE — ED Notes (Signed)
Patient denies LOC, denies anticoag usage, denies headache. States she just wants to get her head scan done and go home.

## 2022-12-18 NOTE — ED Provider Triage Note (Signed)
Emergency Medicine Provider Triage Evaluation Note  Emily Carrillo , a 51 y.o. female  was evaluated in triage.  Pt complains of fall with head injury.  Patient states she was leaning over to pick up son on the floor and she lost her balance, falling forward and hitting her head on the corner of a table.  She went to urgent care who recommended she come to the emergency department for CT scan.  She has a very small laceration on the forehead on the left side with small hematoma.  She denies losing consciousness, denies blood thinner usage.  She does endorse a headache and feeling tired  Review of Systems  Positive: As above Negative: As above  Physical Exam  BP (!) 142/89 (BP Location: Left Arm)   Pulse 97   Temp 98.8 F (37.1 C) (Oral)   Resp 16   Ht 5\' 4"  (1.626 m)   SpO2 100%   BMI 27.59 kg/m  Gen:   Awake, no distress   Resp:  Normal effort  MSK:   Moves extremities without difficulty  Other:    Medical Decision Making  Medically screening exam initiated at 5:13 PM.  Appropriate orders placed.  was informed that the remainder of the evaluation will be completed by another provider, this initial triage assessment does not replace that evaluation, and the importance of remaining in the ED until their evaluation is complete.     Seabron Spates, PA-C 12/18/22 1714

## 2022-12-18 NOTE — Discharge Instructions (Signed)
You were seen today for a laceration of the forehead.  This was repaired with surgical glue.  This should fall off on its own over the next 1 to 2 weeks..  I have attached instructions on care in case she develop possible concussion-like symptoms.  If you develop altered level of consciousness, projectile vomiting, or other life-threatening conditions please return to the emergency department.

## 2023-10-11 ENCOUNTER — Other Ambulatory Visit: Payer: Self-pay

## 2023-10-11 ENCOUNTER — Encounter (HOSPITAL_BASED_OUTPATIENT_CLINIC_OR_DEPARTMENT_OTHER): Payer: Self-pay | Admitting: Emergency Medicine

## 2023-10-11 DIAGNOSIS — R519 Headache, unspecified: Secondary | ICD-10-CM | POA: Insufficient documentation

## 2023-10-11 DIAGNOSIS — H5789 Other specified disorders of eye and adnexa: Secondary | ICD-10-CM | POA: Diagnosis present

## 2023-10-11 DIAGNOSIS — Z5321 Procedure and treatment not carried out due to patient leaving prior to being seen by health care provider: Secondary | ICD-10-CM | POA: Insufficient documentation

## 2023-10-11 NOTE — ED Triage Notes (Signed)
Complaining of the corners of the eyes becoming red. Looks like a blood vessel in the corner has popped. Started this evening. She also has a headache.

## 2023-10-11 NOTE — ED Triage Notes (Signed)
Complaining of the corners of the eyes becoming red. Looks like a blood vessels in the corner have popped. Started this evening also having a little headache

## 2023-10-12 ENCOUNTER — Emergency Department (HOSPITAL_BASED_OUTPATIENT_CLINIC_OR_DEPARTMENT_OTHER)
Admission: EM | Admit: 2023-10-12 | Discharge: 2023-10-12 | Discharge status: Against Medical Advice | Payer: 59 | Attending: Emergency Medicine | Admitting: Emergency Medicine

## 2023-12-06 ENCOUNTER — Ambulatory Visit
Admission: EM | Admit: 2023-12-06 | Discharge: 2023-12-06 | Disposition: A | Discharge status: Home / Self Care | Payer: 59 | Attending: Family Medicine | Admitting: Family Medicine

## 2023-12-06 DIAGNOSIS — J040 Acute laryngitis: Secondary | ICD-10-CM | POA: Diagnosis not present

## 2023-12-06 DIAGNOSIS — J069 Acute upper respiratory infection, unspecified: Secondary | ICD-10-CM

## 2023-12-06 MED ORDER — CETIRIZINE HCL 10 MG PO TABS: 10.0000 mg | ORAL_TABLET | Freq: Every day | ORAL | 0 refills | Status: AC

## 2023-12-06 MED ORDER — PSEUDOEPHEDRINE HCL 30 MG PO TABS
30.0000 mg | ORAL_TABLET | Freq: Three times a day (TID) | ORAL | 0 refills | Status: AC | PRN
Start: 2023-12-06 — End: ?

## 2023-12-06 MED ORDER — BENZONATATE 100 MG PO CAPS
100.0000 mg | ORAL_CAPSULE | Freq: Three times a day (TID) | ORAL | 0 refills | Status: AC | PRN
Start: 2023-12-06 — End: ?

## 2023-12-06 NOTE — ED Triage Notes (Signed)
Pt reports sore throat, cough and congestion x 5 days. Mucinex gives no relief. Sattes her grand kids have sinus infection.

## 2023-12-06 NOTE — Discharge Instructions (Addendum)
We will manage this as a viral upper respiratory infection, laryngitis. You refused the steroid treatment. For sore throat or cough try using a honey-based tea. Use 3 teaspoons of honey with juice squeezed from half lemon. Place shaved pieces of ginger into 1/2-1 cup of water and warm over stove top. Then mix the ingredients and repeat every 4 hours as needed. Please take Tylenol 500mg -650mg  once every 6 hours for fevers, aches and pains. Hydrate very well with at least 2 liters (64 ounces) of water. Eat light meals such as soups (chicken and noodles, chicken wild rice, vegetable).  Do not eat any foods that you are allergic to.  Start an antihistamine like Zyrtec (10mg  daily) for postnasal drainage, sinus congestion.  You can take this together with pseudoephedrine (Sudafed) at a dose of 30 mg 3 times a day or twice daily as needed for the same kind of congestion.  Use the cough capsules as needed.

## 2023-12-06 NOTE — ED Provider Notes (Signed)
[facility]  Note:  This document was prepared using Conservation officer, historic buildings and may include unintentional dictation errors.  MRN: [id number] DOB: 02-16-1971  Subjective:   Emily Carrillo is a 52 y.o. female presenting for 5-day history of throat pain, congestion, coughing.  No chest pain, shortness of breath or wheezing.  Diabetes is well-controlled.  No current facility-administered medications for this encounter.  Current Outpatient Medications:    citalopram (CELEXA) 20 MG tablet, Take 20 mg by mouth at bedtime., Disp: , Rfl:    clindamycin (CLEOCIN) 150 MG capsule, Take 150 mg by mouth 4 (four) times daily., Disp: , Rfl:    cyclobenzaprine (FLEXERIL) 5 MG tablet, Take 5 mg by mouth 3 (three) times daily as needed for muscle spasms., Disp: , Rfl:    Diethylpropion HCl 25 MG TABS, Take 25 mg by mouth 3 (three) times daily before meals., Disp: , Rfl:    DULoxetine (CYMBALTA) 60 MG capsule, Take 60 mg by mouth daily., Disp: , Rfl:    Estradiol (YUVAFEM) 10 MCG TABS vaginal tablet, Place 10 mcg vaginally 2 (two) times a week., Disp: , Rfl:    famotidine (PEPCID) 20 MG tablet, Take 20 mg by mouth daily as needed for heartburn or indigestion., Disp: , Rfl:    Fiber POWD, Take 1 Dose by mouth daily., Disp: , Rfl:    HYDROcodone-acetaminophen (NORCO) 10-325 MG tablet, Take 1 tablet by mouth every 4 (four) hours as needed for moderate pain., Disp: 15 tablet, Rfl: 0   lactulose (CHRONULAC) 10 GM/15ML solution, Take 15 mLs by mouth daily as needed for constipation., Disp: , Rfl:    lidocaine (XYLOCAINE) 5 % ointment, Apply 1 application topically 2 (two) times daily as needed (left shoulder pain). , Disp: , Rfl:    meclizine (ANTIVERT) 25 MG tablet, Take 25 mg by mouth 3 (three) times daily as needed for dizziness., Disp: , Rfl:    metFORMIN (GLUCOPHAGE) 500 MG tablet, Take 500 mg by mouth 2 (two) times daily with a meal., Disp: , Rfl:    NARCAN 4 MG/0.1ML LIQD  nasal spray kit, Place 1 spray into the nose once., Disp: , Rfl:    oxcarbazepine (TRILEPTAL) 600 MG tablet, Take 600 mg by mouth at bedtime., Disp: , Rfl:    Polyethyl Glycol-Propyl Glycol (SYSTANE OP), Place 1 drop into both eyes at bedtime as needed (dry eyes)., Disp: , Rfl:    pravastatin (PRAVACHOL) 10 MG tablet, Take 10 mg by mouth every evening., Disp: , Rfl:    pregabalin (LYRICA) 150 MG capsule, Take 150 mg by mouth 2 (two) times daily., Disp: , Rfl:    telmisartan (MICARDIS) 20 MG tablet, Take 20 mg by mouth daily., Disp: , Rfl:    tolterodine (DETROL LA) 4 MG 24 hr capsule, Take 4 mg by mouth daily., Disp: , Rfl:    triamcinolone cream (KENALOG) 0.1 %, Apply 1 application topically in the morning and at bedtime., Disp: , Rfl:    VYVANSE 50 MG capsule, Take 50 mg by mouth every morning., Disp: , Rfl:    XIIDRA 5 % SOLN, Place 1 drop into both eyes in the morning and at bedtime., Disp: , Rfl:    zolpidem (AMBIEN) 10 MG tablet, Take 10 mg by mouth at bedtime. , Disp: , Rfl:    Allergies  Allergen Reactions   Imitrex [Sumatriptan] Anaphylaxis and Other (See Comments)    Burning all over   Oxycodone Itching   Penicillin  G Procaine Nausea And Vomiting   Amoxicillin Itching and Rash    Did it involve swelling of the face/tongue/throat, SOB, or low BP? No Did it involve sudden or severe rash/hives, skin peeling, or any reaction on the inside of your mouth or nose? Yes Did you need to seek medical attention at a hospital or doctor's office? Yes When did it last happen?      20 + years If all above answers are "NO", may proceed with cephalosporin use.   Ibuprofen Itching and Rash   Penicillins Itching and Rash    Did it involve swelling of the face/tongue/throat, SOB, or low BP? No Did it involve sudden or severe rash/hives, skin peeling, or any reaction on the inside of your mouth or nose? Yes Did you need to seek medical attention at a hospital or doctor's office? Yes When did it last  happen?      20 + years If all above answers are "NO", may proceed with cephalosporin use.      Past Medical History:  Diagnosis Date   Anxiety    Diabetes mellitus without complication (HCC)    Hypertension    Migraine    Renal disorder    Stroke [facility])      Past Surgical History:  Procedure Laterality Date   ABDOMINAL HYSTERECTOMY     Bladder tacking     CYSTOCELE REPAIR N/A 03/19/2020   Procedure: CYSTOSCOPY ANTERIOR REPAIR (CYSTOCELE);  Surgeon: Alfredo Martinez, MD;  Location: [facility];  Service: Urology;  Laterality: N/A;    Family History  Problem Relation Age of Onset   Cancer Mother    Stroke Father    Hypertension Father    Diabetes Father     Social History   Tobacco Use   Smoking status: Never   Smokeless tobacco: Never  Vaping Use   Vaping status: Never Used  Substance Use Topics   Alcohol use: No   Drug use: No    ROS   Objective:   Vitals: BP (!) 151/84 (BP Location: Right Arm)   Pulse 96   Temp 98.8 F (37.1 C) (Oral)   Resp 18   SpO2 95%   Physical Exam Constitutional:      General: She is not in acute distress.    Appearance: Normal appearance. She is well-developed and normal weight. She is not ill-appearing, toxic-appearing or diaphoretic.  HENT:     Head: Normocephalic and atraumatic.     Right Ear: Tympanic membrane, ear canal and external ear normal. No drainage or tenderness. No middle ear effusion. There is no impacted cerumen. Tympanic membrane is not erythematous or bulging.     Left Ear: Tympanic membrane, ear canal and external ear normal. No drainage or tenderness.  No middle ear effusion. There is no impacted cerumen. Tympanic membrane is not erythematous or bulging.     Nose: Congestion present. No rhinorrhea.     Mouth/Throat:     Mouth: Mucous membranes are moist. No oral lesions.     Pharynx: No pharyngeal swelling, oropharyngeal exudate, posterior oropharyngeal erythema or uvula swelling.     Tonsils: No tonsillar  exudate or tonsillar abscesses.     Comments: Postnasal drainage overlying pharynx.  Hoarseness noted. Eyes:     General: No scleral icterus.       Right eye: No discharge.        Left eye: No discharge.     Extraocular Movements: Extraocular movements intact.     Right eye:  Normal extraocular motion.     Left eye: Normal extraocular motion.     Conjunctiva/sclera: Conjunctivae normal.  Cardiovascular:     Rate and Rhythm: Normal rate and regular rhythm.     Heart sounds: Normal heart sounds. No murmur heard.    No friction rub. No gallop.  Pulmonary:     Effort: Pulmonary effort is normal. No respiratory distress.     Breath sounds: No stridor. No wheezing, rhonchi or rales.  Chest:     Chest wall: No tenderness.  Musculoskeletal:     Cervical back: Normal range of motion and neck supple.  Lymphadenopathy:     Cervical: No cervical adenopathy.  Skin:    General: Skin is warm and dry.  Neurological:     General: No focal deficit present.     Mental Status: She is alert and oriented to person, place, and time.  Psychiatric:        Mood and Affect: Mood normal.        Behavior: Behavior normal.     Assessment and Plan :   PDMP not reviewed this encounter.  1. Viral upper respiratory infection   2. Laryngitis    Deferred imaging given clear cardiopulmonary exam, hemodynamically stable vital signs. Offered a steroid injection for her laryngitis which the patient refused.  Suspect viral URI, viral syndrome. Physical exam findings reassuring and vital signs stable for discharge. Advised supportive care, offered symptomatic relief. Counseled patient on potential for adverse effects with medications prescribed/recommended today, ER and return-to-clinic precautions discussed, patient verbalized understanding.     Wallis Bamberg, [place] 12/06/23 1610

## 2023-12-23 ENCOUNTER — Ambulatory Visit (HOSPITAL_BASED_OUTPATIENT_CLINIC_OR_DEPARTMENT_OTHER): Payer: 59 | Attending: Sports Medicine | Admitting: Physical Therapy
